# Patient Record
Sex: Male | Born: 1985 | Race: Black or African American | Hispanic: No | Marital: Married | Smoking: Never smoker
Health system: Southern US, Community
[De-identification: ages and names within clinical notes are randomized; demographics above are authoritative.]

## PROBLEM LIST (undated history)

## (undated) DIAGNOSIS — F909 Attention-deficit hyperactivity disorder, unspecified type: Secondary | ICD-10-CM

## (undated) DIAGNOSIS — F419 Anxiety disorder, unspecified: Secondary | ICD-10-CM

## (undated) DIAGNOSIS — E7800 Pure hypercholesterolemia, unspecified: Secondary | ICD-10-CM

## (undated) HISTORY — PX: TONSILLECTOMY: SUR1361

## (undated) HISTORY — DX: Pure hypercholesterolemia, unspecified: E78.00

## (undated) HISTORY — DX: Anxiety disorder, unspecified: F41.9

## (undated) HISTORY — DX: Attention-deficit hyperactivity disorder, unspecified type: F90.9

---

## 2007-01-18 ENCOUNTER — Emergency Department (HOSPITAL_COMMUNITY): Admission: EM | Admit: 2007-01-18 | Discharge: 2007-01-18 | Payer: Self-pay | Admitting: Emergency Medicine

## 2008-04-01 ENCOUNTER — Emergency Department (HOSPITAL_COMMUNITY): Admission: EM | Admit: 2008-04-01 | Discharge: 2008-04-01 | Payer: Self-pay | Admitting: Emergency Medicine

## 2008-09-09 ENCOUNTER — Emergency Department (HOSPITAL_COMMUNITY): Admission: EM | Admit: 2008-09-09 | Discharge: 2008-09-09 | Payer: Self-pay | Admitting: Emergency Medicine
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
## 2009-03-17 ENCOUNTER — Emergency Department (HOSPITAL_COMMUNITY): Admission: EM | Admit: 2009-03-17 | Discharge: 2009-03-17 | Payer: Self-pay | Admitting: Emergency Medicine

## 2010-06-18 IMAGING — CR DG CHEST 2V
2 series · 2 of 2 positions shown · non-contrast
Comparison: None

CLINICAL DATA: Shortness of breath

CHEST - 2 VIEW

[w chest pa]
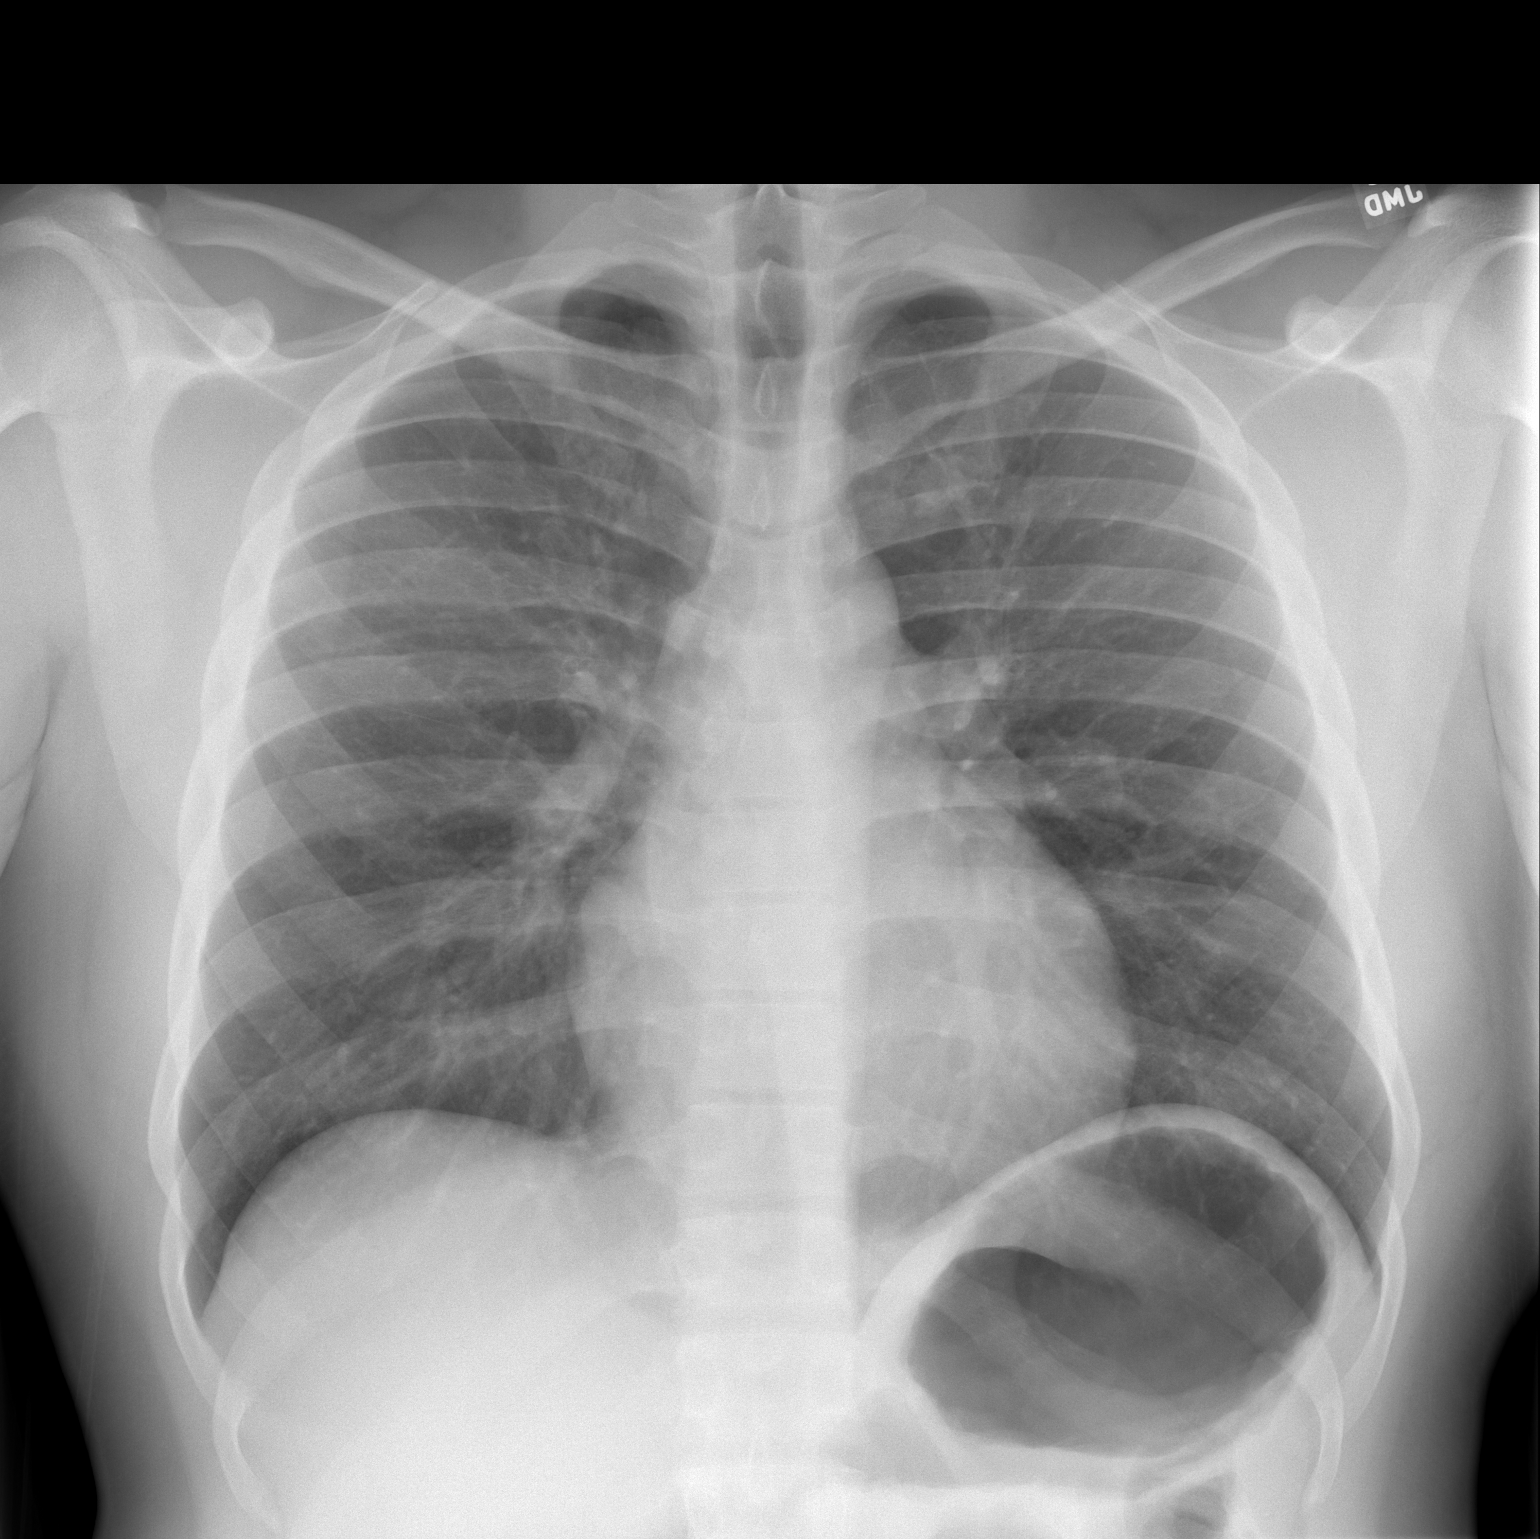

[w chest lat]
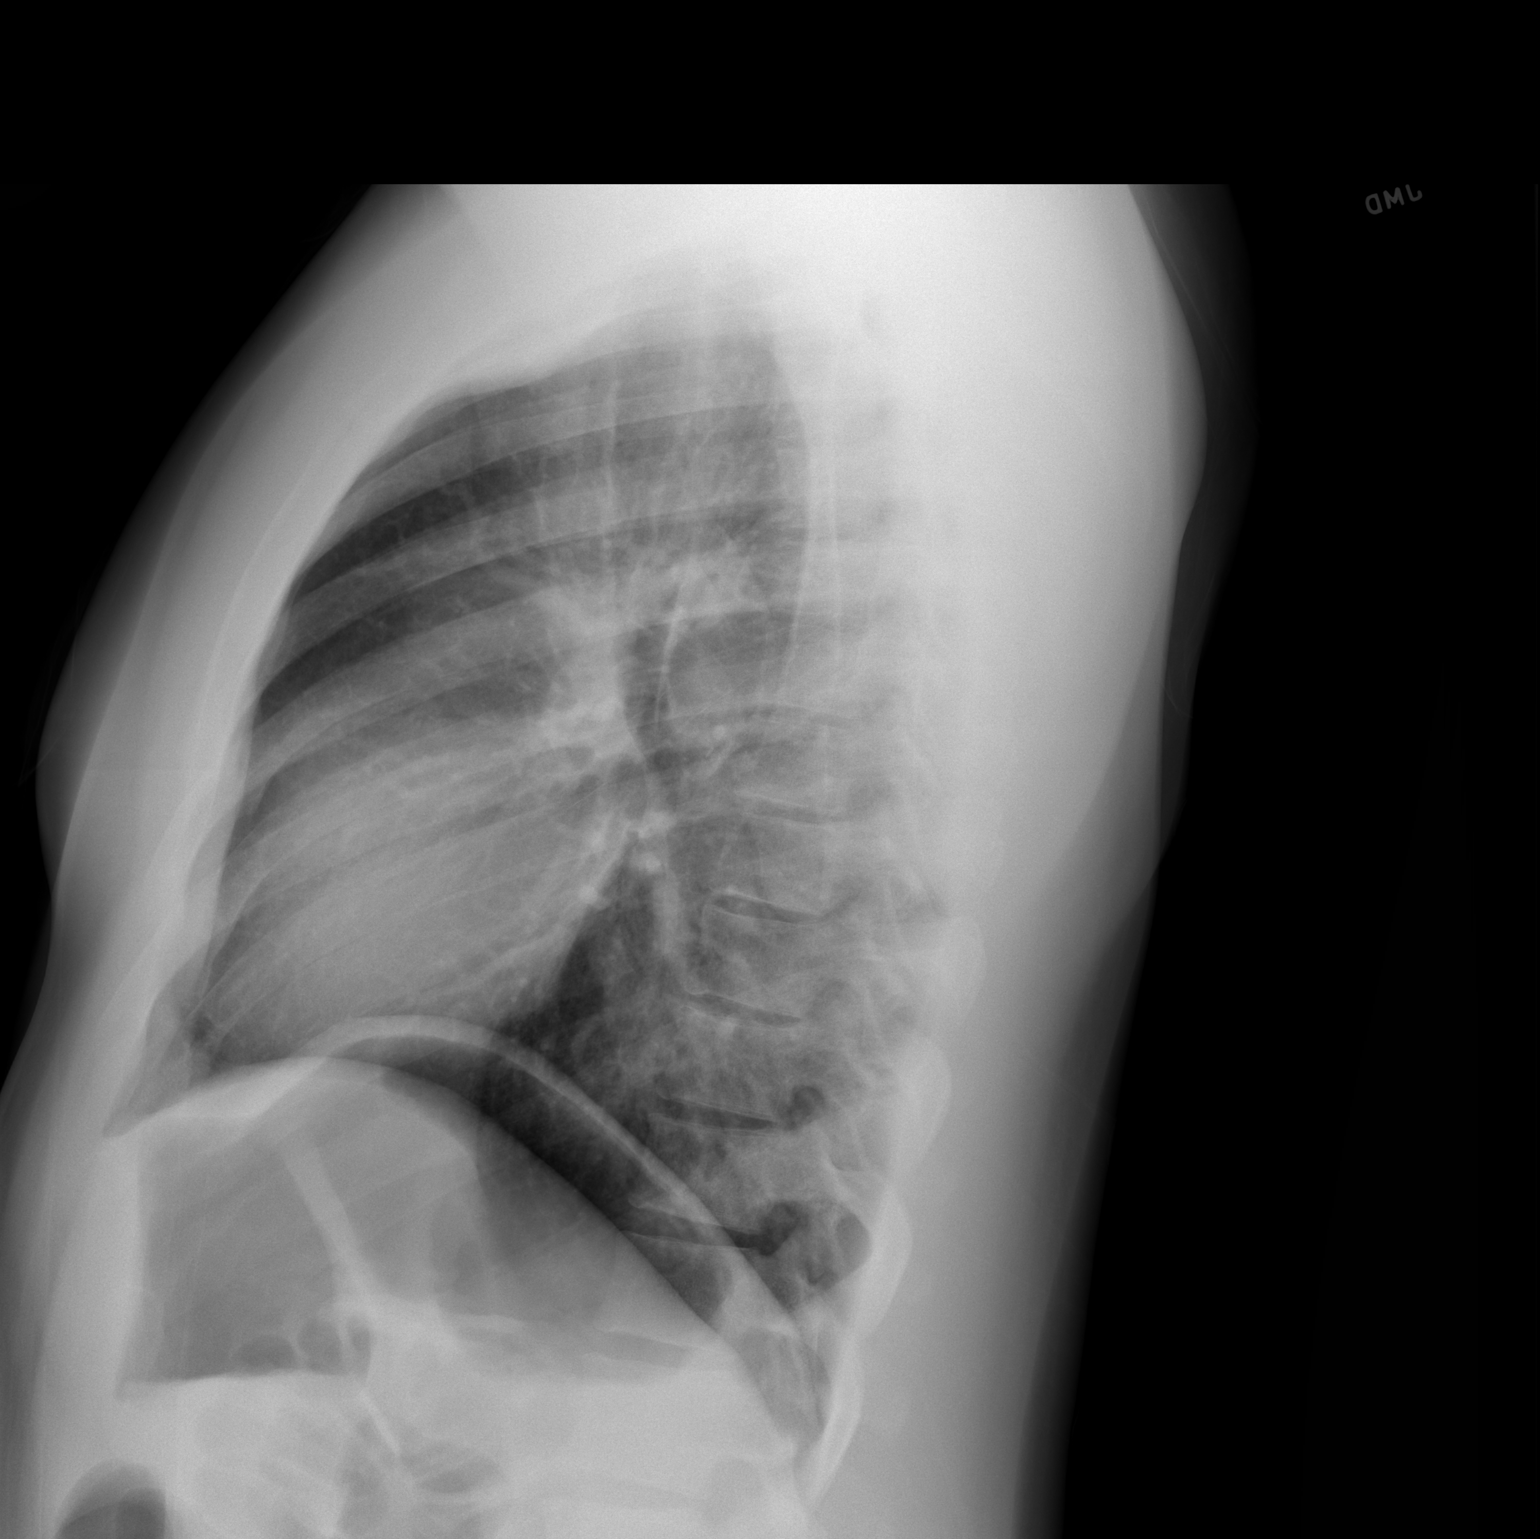

[2 of 2 positions shown; findings below may reference images not displayed]

FINDINGS: Heart size and mediastinal contours are normal.

No pleural effusion or pulmonary interstitial edema.

There is no airspace densities identified.
IMPRESSION: 1.  No active cardiopulmonary disease.

## 2011-08-14 LAB — URINALYSIS, ROUTINE W REFLEX MICROSCOPIC
Glucose, UA: NEGATIVE
Hgb urine dipstick: NEGATIVE
Nitrite: NEGATIVE
Protein, ur: NEGATIVE
Specific Gravity, Urine: 1.014
Urobilinogen, UA: 1

## 2011-08-14 LAB — CBC
HCT: 37.7 — ABNORMAL LOW
MCV: 85.6
Platelets: 177
RBC: 4.4
WBC: 6.5

## 2011-08-14 LAB — DIFFERENTIAL
Basophils Relative: 1
Eosinophils Absolute: 0.1
Lymphocytes Relative: 30
Neutro Abs: 3.9
Neutrophils Relative %: 60

## 2011-08-14 LAB — COMPREHENSIVE METABOLIC PANEL
Albumin: 3.5
BUN: 9
CO2: 24
Calcium: 8.7
Chloride: 103
GFR calc Af Amer: >60
GFR calc non Af Amer: >60
Sodium: 136
Total Bilirubin: 1.4 — ABNORMAL HIGH
Total Protein: 6.5

## 2011-08-14 LAB — LIPASE, BLOOD: Lipase: 18

## 2011-08-14 LAB — AMYLASE: Amylase: 114

## 2015-12-14 ENCOUNTER — Ambulatory Visit: Payer: Self-pay | Admitting: Licensed Clinical Social Worker

## 2021-05-15 ENCOUNTER — Institutional Professional Consult (permissible substitution): Payer: Self-pay | Admitting: Neurology

## 2021-06-28 ENCOUNTER — Institutional Professional Consult (permissible substitution): Payer: Self-pay | Admitting: Neurology

## 2021-08-22 ENCOUNTER — Encounter: Payer: Self-pay | Admitting: *Deleted

## 2021-08-23 ENCOUNTER — Encounter: Payer: Self-pay | Admitting: Neurology

## 2021-08-23 ENCOUNTER — Other Ambulatory Visit: Payer: Self-pay

## 2021-08-23 ENCOUNTER — Ambulatory Visit (INDEPENDENT_AMBULATORY_CARE_PROVIDER_SITE_OTHER): Payer: No Typology Code available for payment source | Admitting: Neurology

## 2021-08-23 VITALS — BP 120/74 | HR 71 | Ht 75.0 in | Wt 303.8 lb

## 2021-08-23 DIAGNOSIS — E669 Obesity, unspecified: Secondary | ICD-10-CM | POA: Diagnosis not present

## 2021-08-23 DIAGNOSIS — G4719 Other hypersomnia: Secondary | ICD-10-CM

## 2021-08-23 DIAGNOSIS — R400 Somnolence: Secondary | ICD-10-CM

## 2021-08-23 DIAGNOSIS — R7989 Other specified abnormal findings of blood chemistry: Secondary | ICD-10-CM

## 2021-08-23 DIAGNOSIS — Z9189 Other specified personal risk factors, not elsewhere classified: Secondary | ICD-10-CM

## 2021-08-23 DIAGNOSIS — R0683 Snoring: Secondary | ICD-10-CM

## 2021-08-23 DIAGNOSIS — R0681 Apnea, not elsewhere classified: Secondary | ICD-10-CM

## 2021-08-23 NOTE — Patient Instructions (Signed)
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
## 2021-10-22 ENCOUNTER — Ambulatory Visit (INDEPENDENT_AMBULATORY_CARE_PROVIDER_SITE_OTHER): Payer: No Typology Code available for payment source | Admitting: Neurology

## 2021-10-22 DIAGNOSIS — R0681 Apnea, not elsewhere classified: Secondary | ICD-10-CM

## 2021-10-22 DIAGNOSIS — E669 Obesity, unspecified: Secondary | ICD-10-CM

## 2021-10-22 DIAGNOSIS — G4719 Other hypersomnia: Secondary | ICD-10-CM

## 2021-10-22 DIAGNOSIS — R400 Somnolence: Secondary | ICD-10-CM

## 2021-10-22 DIAGNOSIS — G4733 Obstructive sleep apnea (adult) (pediatric): Secondary | ICD-10-CM

## 2021-10-22 DIAGNOSIS — R0683 Snoring: Secondary | ICD-10-CM

## 2021-10-22 DIAGNOSIS — Z9189 Other specified personal risk factors, not elsewhere classified: Secondary | ICD-10-CM

## 2021-10-22 DIAGNOSIS — R7989 Other specified abnormal findings of blood chemistry: Secondary | ICD-10-CM

## 2021-10-23 NOTE — Progress Notes (Signed)
   [facility]  HOME SLEEP TEST (Watch PAT) REPORT  STUDY DATE: 10/22/2021  DOB: 1986/04/15  MRN: [id number]  ORDERING CLINICIAN: Huston Foley, MD, PhD   REFERRING CLINICIAN: Dr. Elisabeth Most  CLINICAL INFORMATION/HISTORY: 35 year old right-handed gentleman with an underlying medical history of ADHD, low testosterone and obesity, who reports snoring and excessive daytime somnolence as well as witnessed apneas per wife's report.   Epworth sleepiness score: 17/24.  BMI: 38.2 kg/m  FINDINGS:   Sleep Summary:   Total Recording Time (hours, min): 7 hours, 4 minutes  Total Sleep Time (hours, min):  6 hours, 19 minutes   Percent REM (%):    39.4%   Respiratory Indices:   Calculated pAHI (per hour):  22.6/hour         REM pAHI:    37/hour       NREM pAHI: 13.3/hour  Oxygen Saturation Statistics:    Oxygen Saturation (%) Mean: 95%   Minimum oxygen saturation (%):                 87%   O2 Saturation Range (%): 87-99%    O2 Saturation (minutes) <=88%: 0.3 min  Pulse Rate Statistics:   Pulse Mean (bpm):    72/min    Pulse Range (48-109/min)   IMPRESSION: OSA (obstructive sleep apnea)   RECOMMENDATION:  This home sleep test demonstrates moderate obstructive sleep apnea with a total AHI of 22.6/hour and O2 nadir of 87%.  Mild to moderate snoring was detected.  Treatment with positive airway pressure is recommended. The patient will be advised to proceed with an autoPAP titration/trial at home for now. A full night titration study may be considered to optimize treatment settings, if needed down the road.  Alternative treatment options may include surgical options, or a dental device.  Concomitant weight loss is recommended.  Please note that untreated obstructive sleep apnea may carry additional perioperative morbidity. Patients with significant obstructive sleep apnea should receive perioperative PAP therapy and the surgeons and particularly the  anesthesiologist should be informed of the diagnosis and the severity of the sleep disordered breathing. The patient should be cautioned not to drive, work at heights, or operate dangerous or heavy equipment when tired or sleepy. Review and reiteration of good sleep hygiene measures should be pursued with any patient. Other causes of the patient's symptoms, including circadian rhythm disturbances, an underlying mood disorder, medication effect and/or an underlying medical problem cannot be ruled out based on this test. Clinical correlation is recommended. The patient and his referring provider will be notified of the test results. The patient will be seen in follow up in sleep clinic at [facility].  I certify that I have reviewed the raw data recording prior to the issuance of this report in accordance with the standards of the American Academy of Sleep Medicine (AASM).  INTERPRETING PHYSICIAN:   Huston Foley, MD, PhD  Board Certified in Neurology and Sleep Medicine  [facility] [address], [address] [place], [place] [address] [phone]

## 2021-10-25 NOTE — Addendum Note (Signed)
Addended by: Huston Foley on: 10/25/2021 05:34 PM   Modules accepted: Orders

## 2021-10-25 NOTE — Procedures (Signed)
   [facility]  HOME SLEEP TEST (Watch PAT) REPORT  STUDY DATE: 10/22/2021  DOB: 1986/04/15  MRN: [id number]  ORDERING CLINICIAN: Huston Foley, MD, PhD   REFERRING CLINICIAN: Dr. Elisabeth Most  CLINICAL INFORMATION/HISTORY: 35 year old right-handed gentleman with an underlying medical history of ADHD, low testosterone and obesity, who reports snoring and excessive daytime somnolence as well as witnessed apneas per wife's report.   Epworth sleepiness score: 17/24.  BMI: 38.2 kg/m  FINDINGS:   Sleep Summary:   Total Recording Time (hours, min): 7 hours, 4 minutes  Total Sleep Time (hours, min):  6 hours, 19 minutes   Percent REM (%):    39.4%   Respiratory Indices:   Calculated pAHI (per hour):  22.6/hour         REM pAHI:    37/hour       NREM pAHI: 13.3/hour  Oxygen Saturation Statistics:    Oxygen Saturation (%) Mean: 95%   Minimum oxygen saturation (%):                 87%   O2 Saturation Range (%): 87-99%    O2 Saturation (minutes) <=88%: 0.3 min  Pulse Rate Statistics:   Pulse Mean (bpm):    72/min    Pulse Range (48-109/min)   IMPRESSION: OSA (obstructive sleep apnea)   RECOMMENDATION:  This home sleep test demonstrates moderate obstructive sleep apnea with a total AHI of 22.6/hour and O2 nadir of 87%.  Mild to moderate snoring was detected.  Treatment with positive airway pressure is recommended. The patient will be advised to proceed with an autoPAP titration/trial at home for now. A full night titration study may be considered to optimize treatment settings, if needed down the road.  Alternative treatment options may include surgical options, or a dental device.  Concomitant weight loss is recommended.  Please note that untreated obstructive sleep apnea may carry additional perioperative morbidity. Patients with significant obstructive sleep apnea should receive perioperative PAP therapy and the surgeons and particularly the  anesthesiologist should be informed of the diagnosis and the severity of the sleep disordered breathing. The patient should be cautioned not to drive, work at heights, or operate dangerous or heavy equipment when tired or sleepy. Review and reiteration of good sleep hygiene measures should be pursued with any patient. Other causes of the patient's symptoms, including circadian rhythm disturbances, an underlying mood disorder, medication effect and/or an underlying medical problem cannot be ruled out based on this test. Clinical correlation is recommended. The patient and his referring provider will be notified of the test results. The patient will be seen in follow up in sleep clinic at [facility].  I certify that I have reviewed the raw data recording prior to the issuance of this report in accordance with the standards of the American Academy of Sleep Medicine (AASM).  INTERPRETING PHYSICIAN:   Huston Foley, MD, PhD  Board Certified in Neurology and Sleep Medicine  [facility] [address], [address] [place], [place] [address] [phone]

## 2021-10-29 ENCOUNTER — Encounter: Payer: Self-pay | Admitting: *Deleted

## 2021-10-29 ENCOUNTER — Telehealth: Payer: Self-pay | Admitting: *Deleted

## 2021-10-29 NOTE — Telephone Encounter (Addendum)
Spoke with patient and discussed sleep study results.  Patient aware the recent home sleep test shows obstructive sleep apnea in the moderate range recommendation is to treat this in the form of AutoPap.  Discussed with patient what this means and also the main difference between AutoPap and CPAP.  Patient is amenable to proceeding with AutoPap use.  I let him know we will send orders over to [facility]. Patient aware of insurance compliance requirements which includes using the machine at least 4 hours every night and following up in the sleep clinic 30 to 90 days after set up and also yearly.  Patient mentioned that he travels for work and will be away from home 1 night per week.  He is concerned he would not be able to have the proper set up for CPAP machine when he is away from home.   Pt requests follow-up letter to be sent to him through Mychart and at home. Orders, ss results, office note, and insurance info sent to [facility] for processing. Received a receipt of confirmation. Letter mailed to pt's home.

## 2021-10-29 NOTE — Telephone Encounter (Signed)
-----   Message from Huston Foley, MD sent at 10/25/2021  5:34 PM EST ----- Patient referred by Dr. Elisabeth Most, seen by me on 08/23/2021, patient had a HST on 10/22/2021.    Please call and notify the patient that the recent home sleep test showed obstructive sleep apnea in the moderate range. I recommend treatment in the form of autoPAP, which means, that we don't have to bring him in for a sleep study with CPAP, but will let him start using a so called autoPAP machine at home, which is a CPAP-like machine with self-adjusting pressures. We will send the order to a local DME company (of his choice, or as per insurance requirement). The DME representative will fit him with a mask, educate him on how to use the machine, how to put the mask on, etc. I have placed an order in the chart. Please send the order, talk to patient, send report to referring MD. We will need a FU in sleep clinic for 10 weeks post-PAP set up, please arrange that with me or one of our NPs. Also reinforce the need for compliance with treatment. Thanks,   Huston Foley, MD, PhD [facility])

## 2021-11-08 NOTE — Telephone Encounter (Signed)
Spoke with [facility]. The pt's insurance is not in network with [facility]. I have reached out to [facility] to see if they can take his insurance. Will update ASAP. Updated pt via mychart.

## 2021-11-08 NOTE — Telephone Encounter (Signed)
Pt called states his insurance denied the CPAP. Pt requesting a call back.

## 2021-11-08 NOTE — Telephone Encounter (Signed)
We will contact [facility] first to check into this. We do not do the CPAP authorizations.

## 2021-11-13 NOTE — Telephone Encounter (Signed)
Ross Ludwig, RN; Dimas Millin we are checking on this one.      Previous Messages   ----- Message -----  From: Guy Begin, RN  Sent: 11/13/2021  10:45 AM EST  To: Dimas Millin, Jari Favre, *  Subject: new order in epic,                              Ardelle Lesches  Male, 35 y.o., 07/05/1986  MRN:  [id number]  Phone:  [phone] (H) ...   I am following up on this message.    Thank you Andrey Campanile RN   I called [facility]. They do not take your insurance however I am in the process of waiting to hear back from [facility] to see if they take your insurance. I will update you as soon as I hear back. It might be next week but will update sooner if possible. Thank you for your patience. Merry Christmas!

## 2021-11-18 DIAGNOSIS — G4733 Obstructive sleep apnea (adult) (pediatric): Secondary | ICD-10-CM

## 2021-11-18 HISTORY — DX: Obstructive sleep apnea (adult) (pediatric): G47.33
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
## 2022-01-17 ENCOUNTER — Other Ambulatory Visit: Payer: Self-pay

## 2022-01-17 ENCOUNTER — Ambulatory Visit (INDEPENDENT_AMBULATORY_CARE_PROVIDER_SITE_OTHER): Payer: No Typology Code available for payment source | Admitting: Adult Health

## 2022-01-17 ENCOUNTER — Encounter: Payer: Self-pay | Admitting: Adult Health

## 2022-01-17 VITALS — BP 125/73 | HR 68 | Ht 75.0 in | Wt 304.2 lb

## 2022-01-17 DIAGNOSIS — G4733 Obstructive sleep apnea (adult) (pediatric): Secondary | ICD-10-CM | POA: Diagnosis not present

## 2022-01-17 DIAGNOSIS — Z9989 Dependence on other enabling machines and devices: Secondary | ICD-10-CM

## 2022-05-09 ENCOUNTER — Encounter: Payer: Self-pay | Admitting: Adult Health
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
## 2022-07-30 ENCOUNTER — Telehealth: Payer: Self-pay

## 2022-07-30 ENCOUNTER — Ambulatory Visit (INDEPENDENT_AMBULATORY_CARE_PROVIDER_SITE_OTHER): Payer: Commercial Managed Care - PPO | Admitting: Adult Health

## 2022-07-30 ENCOUNTER — Encounter: Payer: Self-pay | Admitting: Adult Health

## 2022-07-30 VITALS — BP 118/77 | HR 63 | Ht 75.0 in | Wt 311.0 lb

## 2022-07-30 DIAGNOSIS — Z9989 Dependence on other enabling machines and devices: Secondary | ICD-10-CM

## 2022-07-30 DIAGNOSIS — G4733 Obstructive sleep apnea (adult) (pediatric): Secondary | ICD-10-CM | POA: Diagnosis not present

## 2022-07-30 NOTE — Telephone Encounter (Signed)
CPAP orders have been placed and sent to [facility] via [facility] for pt.

## 2022-07-30 NOTE — Patient Instructions (Signed)
Your Plan:  Continue use of CPAP ensuring greater than 4 hours nightly per insurance requirements and for optimal benefit         Thank you for coming to see [place] at [facility]. I hope we have been able to provide you high quality care today.  You may receive a patient satisfaction survey over the next few weeks. We would appreciate your feedback and comments so that we may continue to improve ourselves and the health of our patients.

## 2023-02-26 ENCOUNTER — Encounter: Payer: Self-pay | Admitting: Adult Health

## 2023-05-05 ENCOUNTER — Encounter: Payer: Self-pay | Admitting: Adult Health

## 2023-05-09 ENCOUNTER — Encounter: Payer: Self-pay | Admitting: Neurology

## 2023-06-19 ENCOUNTER — Other Ambulatory Visit (HOSPITAL_BASED_OUTPATIENT_CLINIC_OR_DEPARTMENT_OTHER): Payer: Self-pay

## 2023-06-19 MED ORDER — LISDEXAMFETAMINE DIMESYLATE 20 MG PO CAPS
20.0000 mg | ORAL_CAPSULE | Freq: Every day | ORAL | 0 refills | Status: DC
  Filled 2023-06-19: qty 30, 30d supply, fill #0

## 2023-07-20 ENCOUNTER — Other Ambulatory Visit (HOSPITAL_BASED_OUTPATIENT_CLINIC_OR_DEPARTMENT_OTHER): Payer: Self-pay

## 2023-07-21 ENCOUNTER — Other Ambulatory Visit (HOSPITAL_BASED_OUTPATIENT_CLINIC_OR_DEPARTMENT_OTHER): Payer: Self-pay

## 2023-07-21 MED ORDER — LISDEXAMFETAMINE DIMESYLATE 20 MG PO CAPS
20.0000 mg | ORAL_CAPSULE | Freq: Every day | ORAL | 0 refills | Status: DC
  Filled 2023-07-21: qty 30, 30d supply, fill #0
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
## 2023-07-28 NOTE — Telephone Encounter (Signed)
Was machine brought in for download? He has CPAP visit scheduled tomorrow for follow up.

## 2023-07-29 ENCOUNTER — Encounter: Payer: Self-pay | Admitting: Adult Health

## 2023-07-29 ENCOUNTER — Ambulatory Visit: Payer: BC Managed Care – PPO | Admitting: Adult Health

## 2023-07-29 VITALS — BP 107/71 | HR 70 | Ht 75.0 in | Wt 286.0 lb

## 2023-07-29 DIAGNOSIS — G4733 Obstructive sleep apnea (adult) (pediatric): Secondary | ICD-10-CM | POA: Diagnosis not present

## 2023-07-29 NOTE — Patient Instructions (Addendum)
Your Plan:  Concern nightly use of CPAP with ensuring greater than 4 hours per night for optimal benefit  Continue to follow with your DME company [employer] for any needed supplies or CPAP related concerns     Follow up in 1 year or call earlier if needed     Thank you for coming to see [place] at [facility]. I hope we have been able to provide you high quality care today.  You may receive a patient satisfaction survey over the next few weeks. We would appreciate your feedback and comments so that we may continue to improve ourselves and the health of our patients.

## 2023-08-01 ENCOUNTER — Encounter: Payer: Self-pay | Admitting: Adult Health

## 2023-08-20 ENCOUNTER — Other Ambulatory Visit (HOSPITAL_BASED_OUTPATIENT_CLINIC_OR_DEPARTMENT_OTHER): Payer: Self-pay

## 2023-08-20 MED ORDER — LISDEXAMFETAMINE DIMESYLATE 20 MG PO CAPS
20.0000 mg | ORAL_CAPSULE | Freq: Every day | ORAL | 0 refills | Status: DC
  Filled 2023-08-20: qty 30, 30d supply, fill #0

## 2023-09-02 ENCOUNTER — Other Ambulatory Visit (HOSPITAL_BASED_OUTPATIENT_CLINIC_OR_DEPARTMENT_OTHER): Payer: Self-pay

## 2023-09-04 ENCOUNTER — Encounter: Payer: Self-pay | Admitting: Neurology

## 2023-09-04 ENCOUNTER — Encounter: Payer: Self-pay | Admitting: Adult Health

## 2023-09-19 ENCOUNTER — Other Ambulatory Visit (HOSPITAL_BASED_OUTPATIENT_CLINIC_OR_DEPARTMENT_OTHER): Payer: Self-pay

## 2023-09-22 ENCOUNTER — Other Ambulatory Visit (HOSPITAL_BASED_OUTPATIENT_CLINIC_OR_DEPARTMENT_OTHER): Payer: Self-pay

## 2023-09-22 MED ORDER — LISDEXAMFETAMINE DIMESYLATE 20 MG PO CAPS
20.0000 mg | ORAL_CAPSULE | Freq: Every day | ORAL | 0 refills | Status: DC
  Filled 2023-09-22: qty 30, 30d supply, fill #0

## 2023-10-22 ENCOUNTER — Other Ambulatory Visit (HOSPITAL_BASED_OUTPATIENT_CLINIC_OR_DEPARTMENT_OTHER): Payer: Self-pay

## 2023-10-23 ENCOUNTER — Other Ambulatory Visit (HOSPITAL_BASED_OUTPATIENT_CLINIC_OR_DEPARTMENT_OTHER): Payer: Self-pay

## 2023-10-23 MED ORDER — LISDEXAMFETAMINE DIMESYLATE 20 MG PO CAPS
20.0000 mg | ORAL_CAPSULE | Freq: Every day | ORAL | 0 refills | Status: DC
  Filled 2023-10-23: qty 30, 30d supply, fill #0

## 2023-11-20 ENCOUNTER — Other Ambulatory Visit (HOSPITAL_BASED_OUTPATIENT_CLINIC_OR_DEPARTMENT_OTHER): Payer: Self-pay

## 2023-11-21 ENCOUNTER — Other Ambulatory Visit (HOSPITAL_BASED_OUTPATIENT_CLINIC_OR_DEPARTMENT_OTHER): Payer: Self-pay

## 2023-11-24 ENCOUNTER — Other Ambulatory Visit (HOSPITAL_BASED_OUTPATIENT_CLINIC_OR_DEPARTMENT_OTHER): Payer: Self-pay

## 2023-11-24 MED ORDER — LISDEXAMFETAMINE DIMESYLATE 20 MG PO CAPS
20.0000 mg | ORAL_CAPSULE | Freq: Every day | ORAL | 0 refills | Status: DC
  Filled 2023-11-24: qty 30, 30d supply, fill #0

## 2024-01-01 ENCOUNTER — Other Ambulatory Visit (HOSPITAL_BASED_OUTPATIENT_CLINIC_OR_DEPARTMENT_OTHER): Payer: Self-pay

## 2024-01-08 ENCOUNTER — Other Ambulatory Visit (HOSPITAL_BASED_OUTPATIENT_CLINIC_OR_DEPARTMENT_OTHER): Payer: Self-pay

## 2024-01-08 MED ORDER — LISDEXAMFETAMINE DIMESYLATE 20 MG PO CAPS
20.0000 mg | ORAL_CAPSULE | Freq: Every day | ORAL | 0 refills | Status: DC
  Filled 2024-01-08: qty 30, 30d supply, fill #0

## 2024-02-06 ENCOUNTER — Other Ambulatory Visit (HOSPITAL_BASED_OUTPATIENT_CLINIC_OR_DEPARTMENT_OTHER): Payer: Self-pay

## 2024-02-10 ENCOUNTER — Other Ambulatory Visit (HOSPITAL_BASED_OUTPATIENT_CLINIC_OR_DEPARTMENT_OTHER): Payer: Self-pay

## 2024-02-10 MED ORDER — LISDEXAMFETAMINE DIMESYLATE 20 MG PO CAPS
20.0000 mg | ORAL_CAPSULE | Freq: Every day | ORAL | 0 refills | Status: AC
  Filled 2024-02-10: qty 30, 30d supply, fill #0

## 2024-03-10 ENCOUNTER — Other Ambulatory Visit (HOSPITAL_BASED_OUTPATIENT_CLINIC_OR_DEPARTMENT_OTHER): Payer: Self-pay

## 2024-03-10 MED ORDER — LISDEXAMFETAMINE DIMESYLATE 30 MG PO CAPS
30.0000 mg | ORAL_CAPSULE | Freq: Every day | ORAL | 0 refills | Status: DC
  Filled 2024-03-10: qty 30, 30d supply, fill #0

## 2024-04-05 ENCOUNTER — Other Ambulatory Visit (HOSPITAL_BASED_OUTPATIENT_CLINIC_OR_DEPARTMENT_OTHER): Payer: Self-pay

## 2024-04-06 ENCOUNTER — Other Ambulatory Visit (HOSPITAL_BASED_OUTPATIENT_CLINIC_OR_DEPARTMENT_OTHER): Payer: Self-pay

## 2024-04-06 MED ORDER — LISDEXAMFETAMINE DIMESYLATE 30 MG PO CAPS
30.0000 mg | ORAL_CAPSULE | Freq: Every day | ORAL | 0 refills | Status: DC
  Filled 2024-04-09: qty 30, 30d supply, fill #0

## 2024-04-09 ENCOUNTER — Ambulatory Visit (INDEPENDENT_AMBULATORY_CARE_PROVIDER_SITE_OTHER): Admitting: Psychology

## 2024-04-09 ENCOUNTER — Other Ambulatory Visit (HOSPITAL_BASED_OUTPATIENT_CLINIC_OR_DEPARTMENT_OTHER): Payer: Self-pay

## 2024-04-09 DIAGNOSIS — F439 Reaction to severe stress, unspecified: Secondary | ICD-10-CM | POA: Diagnosis not present

## 2024-04-09 DIAGNOSIS — Z630 Problems in relationship with spouse or partner: Secondary | ICD-10-CM | POA: Diagnosis not present

## 2024-04-09 NOTE — Progress Notes (Unsigned)
 [facility] Counsel[facility] Initial Adult Exam  Name: Justin [name] Date: 04/09/2024 MRN: [id number] DOB: 10-11-1986 PCP: Pcp, No  Time Spent: 10:00 am - 10:   am :  minutes   Guardian/Payee:  Self    Paperwork requested: No   Reason for Visit /Presenting Problem: "My marriage is over after 38 years old. My wife is done with our marriage. I put up walls."  Mental Status Exam: Appearance:   Well Groomed     Behavior:  Appropriate  Motor:  Normal  Speech/Language:   Normal Rate  Affect:  Appropriate  Mood:  normal  Thought process:  normal  Thought content:    WNL  Sensory/Perceptual disturbances:    WNL  Orientation:  oriented to person, place, and time/date  Attention:  Good  Concentration:  Good  Memory:  WNL  Fund of knowledge:   Good  Insight:    Good  Judgment:   Good  Impulse Control:  Good   Reported Symptoms:  ***  Risk Assessment: Danger to Self:  {PSY:22692} Self-injurious Behavior: {PSY:22692} Danger to Others: {PSY:22692} Duty to Warn:{PSY:311194} Physical Aggression / Violence:{PSY:21197} Access to Firearms a concern: {PSY:21197} Gang Involvement:{PSY:21197} Patient / guardian was educated about steps to take if suicide or homicide risk level increases between visits: {Yes/No-Ex:120004} While future psychiatric events cannot be accurately predicted, the patient does not currently require acute inpatient psychiatric care and does not currently meet [facility]  involuntary commitment criteria.  Substance Abuse History: Current substance abuse: {PSY:21197}    Caffeine: Tobacco: Alcohol: Substance use:  Past Psychiatric History:   Previous psychological history is significant for depression, anxiety, marital issues in early twenties Outpatient Providers: Justin [name], saw him for 2 years, at [name]/ Dr. Maeola Schmidt- [place] Attn. [name] History of Psych Hospitalization: No  Psychological Testing: Testosterone  testing, patient had low results in the past, and then just started    Abuse History:  Victim of: No.  Report needed: No. Victim of Neglect:No. Perpetrator of No  Witness / Exposure to Domestic Violence: No   Protective Services Involvement: No  Witness to [employer] Violence:  No   Family History:  Family History  Problem Relation Age of Onset   Diabetes Mother    Cancer Mother    Drug abuse Brother     Living situation: the patient lives with their family, wife Justin [name], Justin [name] 18, Justin [name] 49, Justin [name] 12  Sexual Orientation: Straight  Relationship Status: married  Name of spouse / other:Justin [name], Justin [name] If a parent, number of children / ages: Justin [name] 18, Justin [name] 72, Justin [name] 12  Support Systems: Justin [name] is the only person I can Nature conservation officer Stress:  Yes , I'm not good at finances, budget, or dealing with debt, my wife is very good  Income/Employment/Disability: Employment, Catering manager, 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, drives trucks  Financial planner: No   Educational History: Education: some college  Religion/Sprituality/World View: Non denominational Production designer, theatre/television/film   Any cultural differences that may affect / interfere with treatment:  not applicable   Recreation/Hobbies: Watching sports  Stressors: Marital or family conflict  , my inability to have consistent relationships with people, dealing with family-biological family and children   Strengths: Spirituality = Prayer  Barriers:  None   Legal History: Pending legal issue / charges: The patient has no significant history of legal issues. History of legal issue / charges: N/A  Medical History/Surgical History: reviewed Past Medical History:  Diagnosis Date   ADHD    Anxiety  High cholesterol    OSA on CPAP 2023    Past Surgical History:  Procedure Laterality Date   TONSILLECTOMY      Medications: Current Outpatient Medications  Medication Sig Dispense Refill   lisdexamfetamine (VYVANSE ) 20 MG capsule Take 1 capsule (20 mg  total) by mouth daily. 30 capsule 0   lisdexamfetamine (VYVANSE ) 30 MG capsule Take 1 capsule (30 mg total) by mouth daily. 30 capsule 0   No current facility-administered medications for this visit.    No Known Allergies  Diagnoses:    Psychiatric Treatment: No   Plan of Care: OPT and patient will get a PCP to get a physical ASAP to include testosterone testing  Narrative:  Justin [name] participated from office with therapist and consented to treatment. We reviewed the limits of confidentiality prior to the start of the evaluation. Justin [name] expressed understanding and agreement to proceed.    Currently on medication from an Attention Specialist, upped his dosage recently  A follow-up was scheduled to create a treatment plan and begin treatment. Therapist answered  and all questions during the evaluation and contact information was provided.    Justin [name]

## 2024-04-22 ENCOUNTER — Ambulatory Visit: Admitting: Psychology

## 2024-04-30 ENCOUNTER — Other Ambulatory Visit: Payer: Self-pay

## 2024-04-30 ENCOUNTER — Encounter (HOSPITAL_BASED_OUTPATIENT_CLINIC_OR_DEPARTMENT_OTHER): Payer: Self-pay

## 2024-04-30 ENCOUNTER — Emergency Department (HOSPITAL_BASED_OUTPATIENT_CLINIC_OR_DEPARTMENT_OTHER): Payer: Self-pay

## 2024-04-30 DIAGNOSIS — M549 Dorsalgia, unspecified: Secondary | ICD-10-CM | POA: Diagnosis present

## 2024-04-30 DIAGNOSIS — M546 Pain in thoracic spine: Secondary | ICD-10-CM | POA: Insufficient documentation

## 2024-04-30 LAB — CBC
HCT: 43.8 % (ref 39.0–52.0)
Hemoglobin: 14.4 g/dL (ref 13.0–17.0)
MCH: 28.9 pg (ref 26.0–34.0)
MCHC: 32.9 g/dL (ref 30.0–36.0)
MCV: 87.8 fL (ref 80.0–100.0)
Platelets: 312 10*3/uL (ref 150–400)
RBC: 4.99 MIL/uL (ref 4.22–5.81)
RDW: 12.5 % (ref 11.5–15.5)
WBC: 8 10*3/uL (ref 4.0–10.5)
nRBC: 0 % (ref 0.0–0.2)

## 2024-04-30 NOTE — ED Triage Notes (Signed)
 Pt reports going to urgent care on Sunday for mid-back pain radiating around right side. Pain worsened today and back started locking up per patient. Pain is worse with movement and with deep breaths. Pt reports long flights recently to [place]  prior to pain starting. Denies SOB, CP, fever, urinary symptoms.

## 2024-05-01 ENCOUNTER — Emergency Department (HOSPITAL_BASED_OUTPATIENT_CLINIC_OR_DEPARTMENT_OTHER)
Admission: EM | Admit: 2024-05-01 | Discharge: 2024-05-01 | Disposition: A | Discharge status: Home / Self Care | Payer: Self-pay | Attending: Emergency Medicine | Admitting: Emergency Medicine

## 2024-05-01 DIAGNOSIS — M549 Dorsalgia, unspecified: Secondary | ICD-10-CM

## 2024-05-01 LAB — HEPATIC FUNCTION PANEL
ALT: 48 U/L — ABNORMAL HIGH (ref 0–44)
AST: 31 U/L (ref 15–41)
Albumin: 4.2 g/dL (ref 3.5–5.0)
Alkaline Phosphatase: 81 U/L (ref 38–126)
Bilirubin, Direct: <0.1 mg/dL (ref 0.0–0.2)
Total Bilirubin: 0.2 mg/dL (ref 0.0–1.2)
Total Protein: 7.1 g/dL (ref 6.5–8.1)

## 2024-05-01 LAB — BASIC METABOLIC PANEL WITH GFR
Anion gap: 12 (ref 5–15)
BUN: 14 mg/dL (ref 6–20)
CO2: 21 mmol/L — ABNORMAL LOW (ref 22–32)
Calcium: 8.8 mg/dL — ABNORMAL LOW (ref 8.9–10.3)
Chloride: 104 mmol/L (ref 98–111)
Creatinine, Ser: 0.81 mg/dL (ref 0.61–1.24)
GFR, Estimated: >60 mL/min (ref >60)
Glucose, Bld: 97 mg/dL (ref 70–99)
Potassium: 3.9 mmol/L (ref 3.5–5.1)
Sodium: 137 mmol/L (ref 135–145)

## 2024-05-01 LAB — URINALYSIS, ROUTINE W REFLEX MICROSCOPIC
Bilirubin Urine: NEGATIVE
Glucose, UA: NEGATIVE mg/dL
Hgb urine dipstick: NEGATIVE
Ketones, ur: NEGATIVE mg/dL
Leukocytes,Ua: NEGATIVE
Nitrite: NEGATIVE
Protein, ur: NEGATIVE mg/dL
Specific Gravity, Urine: 1.025 (ref 1.005–1.030)
pH: 6.5 (ref 5.0–8.0)

## 2024-05-01 LAB — TROPONIN T, HIGH SENSITIVITY
Troponin T High Sensitivity: <15 ng/L (ref <19)
Troponin T High Sensitivity: <15 ng/L (ref <19)

## 2024-05-01 LAB — D-DIMER, QUANTITATIVE: D-Dimer, Quant: <0.27 ug{FEU}/mL (ref 0.00–0.50)

## 2024-05-01 MED ORDER — KETOROLAC TROMETHAMINE 30 MG/ML IJ SOLN
15.0000 mg | Freq: Once | INTRAMUSCULAR | Status: AC
  Administered 2024-05-01: 15 mg via INTRAVENOUS
  Filled 2024-05-01: qty 1

## 2024-05-01 NOTE — ED Provider Notes (Signed)
 [place] [facility] Provider Note   CSN: [id number] Arrival date & time: 04/30/24  2301     Patient presents with: No chief complaint on file.   Justin [name] is a 38 y.o. male.   The history is provided by the patient.  Justin [name] is a 38 y.o. male who presents to the Emergency Department complaining of back pain.  He presents to the emergency department for evaluation of back pain for the last 2 months, greatest on the right than the left.  He describes it as a tightening sensation.  He did go to urgent care earlier in the week and was started on naproxen with no significant change in symptoms.  No difference with palpation.  Pain is worse when he breathes or coughs.  No fever, nausea, vomiting, shortness of breath, abdominal pain, change in urine, leg swelling or pain.  No night sweats or weight loss.  He has no medical history.  No reported injuries.  No family medical hx.   No tobacco, alcohol, drugs.         Prior to Admission medications   Medication Sig Start Date End Date Taking? Authorizing Provider  lisdexamfetamine (VYVANSE ) 20 MG capsule Take 1 capsule (20 mg total) by mouth daily. 02/10/24     lisdexamfetamine (VYVANSE ) 30 MG capsule Take 1 capsule (30 mg total) by mouth daily. 04/06/24       Allergies: Patient has no known allergies.    Review of Systems  All other systems reviewed and are negative.   Updated Vital Signs BP 128/78 (BP Location: Right Arm)   Pulse 88   Temp 98.1 F (36.7 C) (Oral)   Resp 18   Ht 6' 3 (1.905 m)   Wt 131.5 kg   SpO2 100%   BMI 36.25 kg/m   Physical Exam Vitals and nursing note reviewed.  Constitutional:      Appearance: He is well-developed.  HENT:     Head: Normocephalic and atraumatic.   Cardiovascular:     Rate and Rhythm: Normal rate and regular rhythm.     Heart sounds: No murmur heard. Pulmonary:     Effort: Pulmonary effort is normal. No respiratory distress.      Breath sounds: Normal breath sounds.  Abdominal:     Palpations: Abdomen is soft.     Tenderness: There is no abdominal tenderness. There is no guarding or rebound.   Musculoskeletal:        General: No tenderness.   Skin:    General: Skin is warm and dry.   Neurological:     Mental Status: He is alert and oriented to person, place, and time.     Comments: 5/5 strength in BLE  Psychiatric:        Behavior: Behavior normal.     (all labs ordered are listed, but only abnormal results are displayed) Labs Reviewed  BASIC METABOLIC PANEL WITH GFR - Abnormal; Notable for the following components:      Result Value   CO2 21 (*)    Calcium 8.8 (*)    All other components within normal limits  HEPATIC FUNCTION PANEL - Abnormal; Notable for the following components:   ALT 48 (*)    All other components within normal limits  CBC  D-DIMER, QUANTITATIVE  URINALYSIS, ROUTINE W REFLEX MICROSCOPIC  TROPONIN T, HIGH SENSITIVITY  TROPONIN T, HIGH SENSITIVITY    EKG: EKG Interpretation Date/Time:  Friday April 30 2024 23:45:32 EDT Ventricular  Rate:  82 PR Interval:  162 QRS Duration:  90 QT Interval:  361 QTC Calculation: 422 R Axis:   -7  Text Interpretation: Sinus rhythm Abnormal R-wave progression, early transition Left ventricular hypertrophy Confirmed by Kelsey Patricia [phone]) on 05/01/2024 12:09:49 AM  Radiology: Lenell Query Chest 2 View Result Date: 05/01/2024 CLINICAL DATA:  Right side and mid back discomfort x several days; seen at [facility] for same. Non smoker. No cough or fever EXAM: CHEST - 2 VIEW COMPARISON:  Chest x-ray 09/09/2008 FINDINGS: The heart and mediastinal contours are within normal limits. No focal consolidation. No pulmonary edema. No pleural effusion. No pneumothorax. No acute osseous abnormality. IMPRESSION: No active cardiopulmonary disease. Electronically Signed   By: Morgane  Naveau M.D.   On: 05/01/2024 00:19     Procedures   Medications Ordered in the ED   ketorolac (TORADOL) 30 MG/ML injection 15 mg (15 mg Intravenous Given 05/01/24 0135)                                    Medical Decision Making Amount and/or Complexity of Data Reviewed Labs: ordered. Radiology: ordered.  Risk Prescription drug management.   Patient here for evaluation of atraumatic bilateral back pain, right greater than left for 2 months.  No reproducible tenderness on examination.  No focal neurologic deficits or red flags.  Labs are unremarkable with normal renal function, hepatic function, urinalysis.  EKG is nonischemic and troponins are negative x 2.  Doubt PE, D-dimer is negative and patient without risk factors.  Discussed with patient unclear source of symptoms.  He was treated with ketorolac in the emergency department with improvement in symptoms.  Discussed ongoing plan for OTC analgesics with PCP follow-up and return precautions.     Final diagnoses:  Mid back pain on right side    ED Discharge Orders     None          Kelsey Patricia, MD 05/01/24 [phone]

## 2024-05-10 ENCOUNTER — Ambulatory Visit: Admitting: Psychology

## 2024-05-10 DIAGNOSIS — F439 Reaction to severe stress, unspecified: Secondary | ICD-10-CM | POA: Diagnosis not present

## 2024-05-10 DIAGNOSIS — Z630 Problems in relationship with spouse or partner: Secondary | ICD-10-CM | POA: Diagnosis not present

## 2024-05-10 NOTE — Progress Notes (Signed)
 [facility] Counselor/Therapist Progress Note  Patient ID: Justin [name], MRN: [id number]   Date: 05/10/24  Time Spent: 1:00 pm - 1: 53 pm  :  53 Minutes  Treatment Type: Individual Therapy.  Reported Symptoms: Anxiety: Feeling nervous, anxious or on edge, not being able to stop or control worrying, worrying too much about different things, trouble relaxing, becoming easily annoyed or irritable, feeling afraid as if something awful might happen. Depression: little interest or pleasure in doing things, feeling down, depressed, or hopeless, trouble falling or staying asleep, or sleeping too much, feeling tired or having little energy, poor appetite or overeating, Feeling bad about yourself--or that you are a failure or have let yourself or your family down, trouble concentrating on things, such as reading the newspaper or watching television, moving or speaking so slowly that other people could have noticed. Or the opposite, being so fidgety or restless that you have been moving around a lot more than usual.   Mental Status Exam: Appearance:  Casual     Behavior: Appropriate  Motor: Normal  Speech/Language:  Normal Rate  Affect: Appropriate  Mood: normal  Thought process: normal  Thought content:   WNL  Sensory/Perceptual disturbances:   WNL  Orientation: oriented to person, place, and time/date  Attention: Good  Concentration: Good  Memory: WNL  Fund of knowledge:  Good  Insight:   Good  Judgment:  Good  Impulse Control: Good   Risk Assessment: Danger to Self:  No Self-injurious Behavior: No Danger to Others: No Duty to Warn:no Physical Aggression / Violence:No  Access to Firearms a concern: No  Gang Involvement:No   Subjective:   [name] participated from home, via video and consented to treatment. Therapist participated from home office. I discussed the limitations of evaluation and management by telemedicine and the availability of in person  appointments. The patient expressed understanding and agreed to proceed. Davaughn reviewed the events of the past week.   Patient continues to deal with marital issues with his wife, [name]. Patient talked about his sister Rona growing up and how she was so bold that he felt the need to shrink. Sister Rona accused the patient of not having a backbone.   We reviewed numerous treatment approaches including CBT, BA, Problem Solving, and Solution focused therapy.   Psych-education regarding the Bladen's diagnosis of Stress due to marital problems [Z63.0]  was provided during the session.   We discussed Markees Enberg's goals treatment goals which include:   Expressing myself A.  I'm not the kind of person to be open with people B.  Gave example of what La Shonda's behavior is like, she'll be 41 in the near future and she has very little of a filter. She will say things that will hurt you in your soul/to be as mean as possible. Ex: Patient talked about his sister Rona growing up and how she was so bold that he felt the need to shrink. Sister Rona accused the patient of not having a backbone.    Have a life outside of work   Humzah Trant provided verbal approval of the treatment plan.   Interventions: Psycho-education & Goal Setting.   Diagnosis:  Stress due to marital problems [Z63.0]   Psychiatric Treatment: No   Treatment Plan:  Client Abilities/Strengths Patient is bright, motivated, and committed to doing what is best for his family.   Support System: Teran reported that his Genia is the only person he can trust.   Scientist, research (physical sciences)  OPT  Client Statement of Needs Eddrick would like to express himself more effectively and have a life outside of work.    Treatment Level Biweekly/Monthly  Symptoms   Anxiety: Feeling nervous, anxious or on edge, not being able to stop or control worrying, worrying too much about different things,  trouble relaxing, becoming easily annoyed or irritable, feeling afraid as if something awful might happen. Depression: little interest or pleasure in doing things, feeling down, depressed, or hopeless, trouble falling or staying asleep, or sleeping too much, feeling tired or having little energy, poor appetite or overeating, Feeling bad about yourself--or that you are a failure or have let yourself or your family down, trouble concentrating on things, such as reading the newspaper or watching television, moving or speaking so slowly that other people could have noticed. Or the opposite, being so fidgety or restless that you have been moving around a lot more than usual.     Goals:   Plumer experiences symptoms of anxiety and stress.   Treatment plan signed and available on s-drive:  No    Target Date: 04/09/25 Frequency: Biweekly/Monthly  Progress: 0 Modality: individual    Therapist will provide referrals for additional resources as appropriate.  Therapist will provide psycho-education regarding Dillard's diagnosis and corresponding treatment approaches and interventions. [name] will support the patient's ability to achieve the goals identified. will employ CBT, BA, Problem-solving, Solution Focused, Mindfulness,  coping skills, & other evidenced-based practices will be used to promote progress towards healthy functioning to help manage decrease symptoms associated with their diagnosis.   Reduce overall level, frequency, and intensity of the feelings of depression, anxiety and panic evidenced by decreased overall symptoms from 6 to 7 days/week to 0 to 1 days/week per client report for at least 3 consecutive months. Verbally express understanding of the relationship between feelings of depression, anxiety and their impact on thinking patterns and behaviors. Verbalize an understanding of the role that distorted thinking plays in creating fears, excessive worry, and  ruminations.    (Kevonta participated in the creation of the treatment plan)    [name]

## 2024-05-12 ENCOUNTER — Other Ambulatory Visit (HOSPITAL_BASED_OUTPATIENT_CLINIC_OR_DEPARTMENT_OTHER): Payer: Self-pay

## 2024-05-12 MED ORDER — LISDEXAMFETAMINE DIMESYLATE 30 MG PO CAPS
30.0000 mg | ORAL_CAPSULE | Freq: Every day | ORAL | 0 refills | Status: DC
  Filled 2024-05-12: qty 30, 30d supply, fill #0

## 2024-05-28 ENCOUNTER — Encounter: Admitting: Psychology

## 2024-05-28 NOTE — Progress Notes (Signed)
 This encounter was created in error - please disregard.

## 2024-05-28 NOTE — Progress Notes (Deleted)
                Justin [name]

## 2024-06-13 ENCOUNTER — Other Ambulatory Visit (HOSPITAL_BASED_OUTPATIENT_CLINIC_OR_DEPARTMENT_OTHER): Payer: Self-pay

## 2024-06-14 ENCOUNTER — Other Ambulatory Visit (HOSPITAL_BASED_OUTPATIENT_CLINIC_OR_DEPARTMENT_OTHER): Payer: Self-pay

## 2024-06-14 ENCOUNTER — Other Ambulatory Visit: Payer: Self-pay

## 2024-06-14 MED ORDER — LISDEXAMFETAMINE DIMESYLATE 30 MG PO CAPS
30.0000 mg | ORAL_CAPSULE | Freq: Every day | ORAL | 0 refills | Status: DC
  Filled 2024-06-14: qty 30, 30d supply, fill #0

## 2024-06-23 ENCOUNTER — Ambulatory Visit: Admitting: Psychology

## 2024-06-23 DIAGNOSIS — Z630 Problems in relationship with spouse or partner: Secondary | ICD-10-CM

## 2024-06-23 DIAGNOSIS — F439 Reaction to severe stress, unspecified: Secondary | ICD-10-CM | POA: Diagnosis not present

## 2024-06-23 NOTE — Progress Notes (Unsigned)
 [facility] Counselor/Therapist Progress Note  Patient ID: Monica Zahler, MRN: [id number]    Date: 06/23/24  Time Spent: 9:02 am - 9:55 am :  53 minutes  Treatment Type: Individual Therapy.  Reported Symptoms: Anxiety and Depression  Mental Status Exam: Appearance:  Casual     Behavior: Appropriate  Motor: Normal  Speech/Language:  Normal Rate  Affect: Appropriate  Mood: normal  Thought process: normal  Thought content:   WNL  Sensory/Perceptual disturbances:   WNL  Orientation: oriented to person, place, and time/date  Attention: Good  Concentration: Good  Memory: WNL  Fund of knowledge:  Good  Insight:   Good  Judgment:  Good  Impulse Control: Good   Risk Assessment: Danger to Self:  No Self-injurious Behavior: No Danger to Others: No Duty to Warn:no Physical Aggression / Violence:No  Access to Firearms a concern: No  Gang Involvement:No   Subjective:   Tayler Sardo participated from home, via video, and consented to treatment. I discussed the limitations of evaluation and management by telemedicine and the availability of in person appointments. The patient expressed understanding and agreed to proceed.  Therapist participated from home office.  Heman reviewed the events of the past week.   [name] reported that something happened recently with his wife that caused her to want to seek counseling.  [facility], patient's wife, will be starting to see an individual therapist on 8/14. Patient reported that both he and [name] want to make the marriage work.  Patient reports his wife saying that he (pt) is getting to the point where she has been for a while. Example, wife feels like patient has pulled back from her in the past, now patient's wife is pulling back from patient. Not dealing with lust issues.  35% wife responsible>>>65%  [name]  Interventions: Cognitive Behavioral Therapy  Diagnosis: Stress due to marital problems [Z63.0]   Psychiatric  Treatment: No   Treatment Plan:  Client Abilities/Strengths Aizen is bright, motivated, and committed to doing what is best for his family.   Support System: Arend reported that his Genia is the only person he can trust..  Client Treatment Preferences OPT  Client Statement of Needs Eris would like to Vontrell reported that his Bishop is the only person he can trust.    Treatment Level Biweekly  Symptoms Anxiety and Depression Goals:      Target Date: *** Frequency: {Frequency of sessions.:26745}  Progress: 0 Modality: individual    Therapist will provide referrals for additional resources as appropriate.  Therapist will provide psycho-education regarding Viggo's diagnosis and corresponding treatment approaches and interventions. [name] will support the patient's ability to achieve the goals identified. will employ CBT, BA, Problem-solving, Solution Focused, Mindfulness,  coping skills, & other evidenced-based practices will be used to promote progress towards healthy functioning to help manage decrease symptoms associated with {his/her/their:21314} diagnosis.   Reduce overall level, frequency, and intensity of the feelings of depression, anxiety and panic evidenced by decreased *** from 6 to 7 days/week to 0 to 1 days/week per client report for at least 3 consecutive months. Verbally express understanding of the relationship between feelings of ***depression, ***anxiety and their impact on thinking patterns and behaviors. Verbalize an understanding of the role that distorted thinking plays in creating fears, excessive worry, and ruminations.  (Alireza participated in the creation of the treatment plan)   [name] Jenkins CHRISTELLA  Maudie

## 2024-07-02 ENCOUNTER — Encounter: Admitting: Psychology

## 2024-07-02 NOTE — Progress Notes (Signed)
 This encounter was created in error - please disregard.

## 2024-07-02 NOTE — Progress Notes (Deleted)
                Justin [name]

## 2024-07-08 ENCOUNTER — Ambulatory Visit (INDEPENDENT_AMBULATORY_CARE_PROVIDER_SITE_OTHER): Admitting: Psychology

## 2024-07-08 DIAGNOSIS — F439 Reaction to severe stress, unspecified: Secondary | ICD-10-CM

## 2024-07-08 DIAGNOSIS — Z630 Problems in relationship with spouse or partner: Secondary | ICD-10-CM | POA: Diagnosis not present

## 2024-07-08 NOTE — Progress Notes (Signed)
 [facility] Counselor/Therapist Progress Note  Patient ID: Jamin Panther, MRN: [id number]    Date: 07/08/24  Time Spent: 12:00 pm - 12:55 pm : 55 minutes   Treatment Type: Individual Therapy.  Reported Symptoms: Anxiety and Depression  Mental Status Exam: Appearance:  Casual     Behavior: Appropriate  Motor: Normal  Speech/Language:  Normal Rate  Affect: Appropriate  Mood: normal  Thought process: normal  Thought content:   WNL  Sensory/Perceptual disturbances:   WNL  Orientation: oriented to person, place, and time/date  Attention: Good  Concentration: Good  Memory: WNL  Fund of knowledge:  Good  Insight:   Good  Judgment:  Good  Impulse Control: Good   Risk Assessment: Danger to Self:  No Self-injurious Behavior: No Danger to Others: No Duty to Warn:no Physical Aggression / Violence:No  Access to Firearms a concern: No  Gang Involvement:No   Subjective:   Gurtej Rials participated from home, via video, and consented to treatment. I discussed the limitations of evaluation and management by telemedicine and the availability of in person appointments. The patient expressed understanding and agreed to proceed.  Therapist participated from home office.  Kaidin reviewed the events of the past week.   Patient related a stressful situation that happened between him and his wife, [name].  Interventions: Cognitive Behavioral Therapy  Diagnosis: Stress due to marital problems [Z63.0]   Psychiatric Treatment: No  Treatment Plan:  Client Abilities/Strengths Daekwon is bright, motivated, and committed to doing what is best for his family.   Support System: [name] reported that his Genia is the only person he can trust.   Client Treatment Preferences OPT  Client Statement of Needs Dionis would like to express himself more effectively and have a life outside of work.   Treatment Level Biweekly  Symptoms  Alvis experiences  symptoms of anxiety and stress.   Goals:   Miller experiences symptoms of anxiety and stress.   Target Date: 04/09/25 Frequency: Biweekly  Progress: 0 Modality: individual    Therapist will provide referrals for additional resources as appropriate.  Therapist will provide psycho-education regarding Jovaughn's diagnosis and corresponding treatment approaches and interventions. [name] will support the patient's ability to achieve the goals identified. will employ CBT, BA, Problem-solving, Solution Focused, Mindfulness,  coping skills, & other evidenced-based practices will be used to promote progress towards healthy functioning to help manage decrease symptoms associated with their diagnosis.   Reduce overall level, frequency, and intensity of the feelings of depression, anxiety and panic evidenced by decreased overall symptoms from 6 to 7 days/week to 0 to 1 days/week per client report for at least 3 consecutive months. Verbally express understanding of the relationship between feelings of anxiety and their impact on thinking patterns and behaviors. Verbalize an understanding of the role that distorted thinking plays in creating fears, excessive worry, and ruminations.  (Nyquan participated in the creation of the treatment plan)   [name]

## 2024-07-11 ENCOUNTER — Other Ambulatory Visit (HOSPITAL_BASED_OUTPATIENT_CLINIC_OR_DEPARTMENT_OTHER): Payer: Self-pay

## 2024-07-13 ENCOUNTER — Other Ambulatory Visit (HOSPITAL_BASED_OUTPATIENT_CLINIC_OR_DEPARTMENT_OTHER): Payer: Self-pay

## 2024-07-13 MED ORDER — LISDEXAMFETAMINE DIMESYLATE 30 MG PO CAPS
30.0000 mg | ORAL_CAPSULE | Freq: Every day | ORAL | 0 refills | Status: DC
  Filled 2024-07-14: qty 30, 30d supply, fill #0

## 2024-07-14 ENCOUNTER — Other Ambulatory Visit (HOSPITAL_BASED_OUTPATIENT_CLINIC_OR_DEPARTMENT_OTHER): Payer: Self-pay

## 2024-07-15 ENCOUNTER — Other Ambulatory Visit (HOSPITAL_BASED_OUTPATIENT_CLINIC_OR_DEPARTMENT_OTHER): Payer: Self-pay

## 2024-07-16 ENCOUNTER — Ambulatory Visit (INDEPENDENT_AMBULATORY_CARE_PROVIDER_SITE_OTHER): Admitting: Psychology

## 2024-07-16 DIAGNOSIS — F439 Reaction to severe stress, unspecified: Secondary | ICD-10-CM | POA: Diagnosis not present

## 2024-07-16 DIAGNOSIS — Z630 Problems in relationship with spouse or partner: Secondary | ICD-10-CM

## 2024-07-16 NOTE — Progress Notes (Signed)
 [facility] Counselor/Therapist Progress Note  Patient ID: Justin [name], MRN: [id number]    Date: 07/16/24  Time Spent: 5:00 pm - 5:53 pm : 53 minutes     Treatment Type: Individual Therapy.  Reported Symptoms: Anxiety and Depression  Mental Status Exam: Appearance:  Casual     Behavior: Appropriate  Motor: Normal  Speech/Language:  Normal Rate  Affect: Appropriate  Mood: normal  Thought process: normal  Thought content:   WNL  Sensory/Perceptual disturbances:   WNL  Orientation: oriented to person, place, and time/date  Attention: Good  Concentration: Good  Memory: WNL  Fund of knowledge:  Good  Insight:   Good  Judgment:  Good  Impulse Control: Good   Risk Assessment: Danger to Self:  No Self-injurious Behavior: No Danger to Others: No Duty to Warn:no Physical Aggression / Violence:No  Access to Firearms a concern: No  Gang Involvement:No   Subjective:   Justin [name] participated from home, via video, and consented to treatment. I discussed the limitations of evaluation and management by telemedicine and the availability of in person appointments. The patient expressed understanding and agreed to proceed Therapist participated from home office. Justin [name] reviewed the events of the past week.   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 Patient is taking steps to manage his stress regarding the marriage.    Interventions: Cognitive Behavioral Therapy  Diagnosis: Stress due to marital problems [Z63.0]   Psychiatric Treatment: No   Treatment Plan:  Client Abilities/Strengths Justin [name] is bright and committed to doing what is best for his family.  Support System: His Bishop  Client Treatment Preferences OPT  Client Statement of Needs Justin [name] would like to express himself more effectively and have a life outside of work.    Treatment  Level Biweekly  Symptoms  Anxiety and stress  Goals:   Target Date: 04/09/25 Frequency: Biweekly  Progress: 15% Modality: individual    Therapist will provide referrals for additional resources as appropriate.  Therapist will provide psycho-education regarding Justin [name]'s diagnosis and corresponding treatment approaches and interventions. Justin [name] will support the patient's ability to achieve the goals identified. will employ CBT, BA, Problem-solving, Solution Focused, Mindfulness,  coping skills, & other evidenced-based practices will be used to promote progress towards healthy functioning to help manage decrease symptoms associated with their diagnosis.   Reduce overall level, frequency, and intensity of the feelings of depression, anxiety and panic evidenced by decreased overall symptoms from 6 to 7 days/week to 0 to 1 days/week per client report for at least 3 consecutive months. Verbally express understanding of the relationship between feelings of anxiety and their impact on thinking patterns and behaviors. Verbalize an understanding of the role that distorted thinking plays in creating fears, excessive worry, and ruminations.  ([name] participated in the creation of the treatment plan)    Justin [name]

## 2024-07-26 ENCOUNTER — Ambulatory Visit (INDEPENDENT_AMBULATORY_CARE_PROVIDER_SITE_OTHER): Admitting: Psychology

## 2024-07-26 DIAGNOSIS — Z630 Problems in relationship with spouse or partner: Secondary | ICD-10-CM | POA: Diagnosis not present

## 2024-07-26 DIAGNOSIS — F439 Reaction to severe stress, unspecified: Secondary | ICD-10-CM

## 2024-07-26 NOTE — Progress Notes (Signed)
 [facility] Counselor/Therapist Progress Note  Patient ID: Justin [name], MRN: [id number]    Date: 07/26/24  Time Spent: 11:01 am - 11:54 am - 53 minutes    Treatment Type: Individual Therapy.  Reported Symptoms: Anxiety and Depression  Mental Status Exam: Appearance:  NA     Behavior: Appropriate  Motor: Normal  Speech/Language:  Normal Rate  Affect: Appropriate  Mood: normal  Thought process: normal  Thought content:   WNL  Sensory/Perceptual disturbances:   WNL  Orientation: oriented to person, place, and time/date  Attention: Good  Concentration: Good  Memory: WNL  Fund of knowledge:  Good  Insight:   Good  Judgment:  Good  Impulse Control: Good   Risk Assessment: Danger to Self:  No Self-injurious Behavior: No Danger to Others: No Duty to Warn:no Physical Aggression / Violence:No  Access to Firearms a concern: No  Gang Involvement:No   Subjective:   Justin [name] participated from home, via video, and consented to treatment. I discussed the limitations of evaluation and management by telemedicine and the availability of in person appointments. The patient expressed understanding and agreed to proceed.  Therapist participated from home office.  Justin [name] reviewed the events of the past week.   Patient is no longer interested in going to marital therapy because his wife is no long interested in going to marital therapy. Patient described himself as having a love language of affirmations, and his wife having a love language of service. Patient is working on decreasing his worrying when it comes to communicating with his wife. Patient discussed similarities between Justin [name], and his wife, Justin [name]  Interventions: Cognitive Behavioral Therapy  Diagnosis: Stress due to marital problems (Z63.0)  Psychiatric Treatment: No   Treatment Plan:  Client Abilities/Strengths Justin [name] is bright and committed to doing what is best for his family.    Support System: His Bishop  Client Treatment Preferences OPT  Client Statement of Needs Justin [name] would like to express himself more effectively and have a life outside of work.    Treatment Level Biweekly  Symptoms  Justin [name] experiences symptoms of anxiety and stress.  Goals:   Target Date: 04/09/25 Frequency: Biweekly  Progress: 20% Modality: individual    Therapist will provide referrals for additional resources as appropriate.  Therapist will provide psycho-education regarding Irl's diagnosis and corresponding treatment approaches and interventions. [name] will support the patient's ability to achieve the goals identified. will employ CBT, BA, Problem-solving, Solution Focused, Mindfulness,  coping skills, & other evidenced-based practices will be used to promote progress towards healthy functioning to help manage decrease symptoms associated with their diagnosis.   Reduce overall level, frequency, and intensity of the feelings of depression, anxiety and panic evidenced by decreased overall symptoms from 6 to 7 days/week to 0 to 1 days/week per client report for at least 3 consecutive months. Verbally express understanding of the relationship between feelings of anxiety and their impact on thinking patterns and behaviors. Verbalize an understanding of the role that distorted thinking plays in creating fears, excessive worry, and ruminations.  (Dijuan participated in the creation of the treatment plan)    [name]

## 2024-08-06 ENCOUNTER — Ambulatory Visit (INDEPENDENT_AMBULATORY_CARE_PROVIDER_SITE_OTHER): Admitting: Psychology

## 2024-08-06 DIAGNOSIS — F439 Reaction to severe stress, unspecified: Secondary | ICD-10-CM

## 2024-08-06 DIAGNOSIS — Z630 Problems in relationship with spouse or partner: Secondary | ICD-10-CM | POA: Diagnosis not present

## 2024-08-06 DIAGNOSIS — F419 Anxiety disorder, unspecified: Secondary | ICD-10-CM

## 2024-08-06 NOTE — Progress Notes (Signed)
 [facility] Counselor/Therapist Progress Note  Patient ID: Justin [name], MRN: [id number]    Date: 08/06/24  Time Spent: 4:04 pm- 4:57 pm: 53 minutes (There were technology issues so we started a few minutes late.)  Treatment Type: Individual Therapy.  Reported Symptoms: Anxiety: Feeling nervous, anxious or on edge, not being able to stop or control worrying, worrying too much about different things, trouble relaxing, becoming easily annoyed or irritable, feeling afraid as if something awful might happen. Depression: little interest or pleasure in doing things, feeling down, depressed, or hopeless, trouble falling or staying asleep, or sleeping too much, feeling tired or having little energy, poor appetite or overeating, Feeling bad about yourself--or that you are a failure or have let yourself or your family down, trouble concentrating on things, such as reading the newspaper or watching television, moving or speaking so slowly that other people could have noticed. Or the opposite, being so fidgety or restless that you have been moving around a lot more than usual.   Mental Status Exam Appearance:  NA     Behavior: Appropriate  Motor: Normal  Speech/Language:  Normal Rate  Affect: Negative  Mood: depressed  Thought process: normal  Thought content:   WNL  Sensory/Perceptual disturbances:   WNL  Orientation: oriented to person, place, and time/date  Attention: Good  Concentration: Good  Memory: WNL  Fund of knowledge:  Good  Insight:   Good  Judgment:  Good  Impulse Control: Good   Risk Assessment: Danger to Self:  No Self-injurious Behavior: No Danger to Others: No Duty to Warn:no Physical Aggression / Violence:No  Access to Firearms a concern: No  Gang Involvement:No   Subjective:   Justin [name] participated from home, via video, and consented to treatment. I discussed the limitations of evaluation and management by telemedicine and the availability of in  person appointments. The patient expressed understanding and agreed to proceed.  Therapist participated from home office.  Atom reviewed the events of the past week.   Patient reported  feeling depressed about his marriage possibly ending and and anxious about being alone.  The goals below are true for if the marriage gets back together, AND if not, the goals will be true for patient being divorced and moving on in his life.   Express myself more Be more open with people Have a life outside of work  We will continue to work on increasing his energy level and practicing stress management techniques.    Interventions: Cognitive Behavioral Therapy  Diagnosis: Stress due to marital problems (Z63.0)  Psychiatric Treatment: No   Treatment Plan:  Client Abilities/Strengths Stoy is bright and committed to doing what is best for his family.   Support System: His Bishop  Client Treatment Preferences OPT  Client Statement of Needs Gilles would like to express himself more effectively and have a life outside of work.   Treatment Level Biweekly  Symptoms  [name] experiences symptoms of anxiety and depression with regard to the stress he feels in his marriage.    Goals:   Target Date: 04/09/25 Frequency: Biweekly  Progress: 0 Modality: individual    Therapist will provide referrals for additional resources as appropriate.  Therapist will provide psycho-education regarding Hazem's diagnosis and corresponding treatment approaches and interventions. [name] will support the patient's ability to achieve the goals identified. will employ CBT, BA, Problem-solving, Solution Focused, Mindfulness,  coping skills, & other evidenced-based practices will be used to promote progress towards healthy functioning to help  manage decrease symptoms associated with their diagnosis.   Reduce overall level, frequency, and intensity of the feelings of depression, anxiety and panic  evidenced by decreased overall symptoms from 6 to 7 days/week to 0 to 1 days/week per client report for at least 3 consecutive months. Verbally express understanding of the relationship between feelings of depression and anxiety and their impact on thinking patterns and behaviors. Verbalize an understanding of the role that distorted thinking plays in creating fears, excessive worry, and ruminations.  (Starlin participated in the creation of the treatment plan)    [name]

## 2024-08-12 ENCOUNTER — Other Ambulatory Visit (HOSPITAL_BASED_OUTPATIENT_CLINIC_OR_DEPARTMENT_OTHER): Payer: Self-pay

## 2024-08-12 ENCOUNTER — Other Ambulatory Visit: Payer: Self-pay

## 2024-08-12 MED ORDER — LISDEXAMFETAMINE DIMESYLATE 30 MG PO CAPS
30.0000 mg | ORAL_CAPSULE | Freq: Every day | ORAL | 0 refills | Status: DC
  Filled 2024-08-12: qty 30, 30d supply, fill #0

## 2024-08-13 ENCOUNTER — Ambulatory Visit: Admitting: Psychology

## 2024-08-13 DIAGNOSIS — F439 Reaction to severe stress, unspecified: Secondary | ICD-10-CM

## 2024-08-13 DIAGNOSIS — Z630 Problems in relationship with spouse or partner: Secondary | ICD-10-CM

## 2024-08-13 NOTE — Progress Notes (Signed)
 [facility] Counselor/Therapist Progress Note  Patient ID: Justin [name], MRN: [id number]    Date: 08/13/24  Time Spent: 5:01 pm - 5:54 pm : 53 minutes  Treatment Type: Individual Therapy.  Reported Symptoms/Chief Complaint: Patient reported that this past week has led to a roller coaster of emotions for him.  Mental Status Exam: Appearance:  NA     Behavior: Appropriate  Motor: Normal  Speech/Language:  Normal Rate  Affect: Appropriate  Mood: normal  Thought process: normal  Thought content:   WNL  Sensory/Perceptual disturbances:   WNL  Orientation: oriented to person, place, and time/date  Attention: Good  Concentration: Good  Memory: WNL  Fund of knowledge:  Good  Insight:   Good  Judgment:  Good  Impulse Control: Good   Risk Assessment: Danger to Self:  No Self-injurious Behavior: No Danger to Others: No Duty to Warn:no Physical Aggression / Violence:No  Access to Firearms a concern: No  Gang Involvement:No   Subjective:   Justin [name] participated from home, via video, and consented to treatment. I discussed the limitations of evaluation and management by telemedicine and the availability of in person appointments. The patient expressed understanding and agreed to proceed.  Therapist participated from home office.  Justin [name] reviewed the events of the past week.   Patient reported that he was tearful this past this week when it hit him. Patient and his wife are in the process of packing up their house to be sold, then they will split the cost of the house once it is sold. Patient's wife already has a new place to live. Anger and disappointment were the theme throughout today's session. Patient verbalizes the need for self-care now and in the future.   Interventions: Cognitive Behavioral Therapy  Diagnosis: Stress due to marital problems (Z63.0)  Psychiatric Treatment: No   Treatment Plan:  Client Abilities/Strengths Justin [name] is bright and  committed to doing what is best for his family.   Support System: His Bishop  Client Treatment Preferences OPT  Client Statement of Needs Justin [name] would like to express himself more effectively and have a life outside of work   Treatment Level Biweekly  Symptoms  Justin [name] experiences symptoms of anxiety and depression with regard to the stress he feels in his marriage.   Goals:   Target Date: 04/09/25 Frequency: Biweekly  Progress: 15% Modality: individual    Therapist will provide referrals for additional resources as appropriate.  Therapist will provide psycho-education regarding Justin [name]'s diagnosis and corresponding treatment approaches and interventions. Justin [name] [name] will support the patient's ability to achieve the goals identified. will employ CBT, BA, Problem-solving, Solution Focused, Mindfulness,  coping skills, & other evidenced-based practices will be used to promote progress towards healthy functioning to help manage decrease symptoms associated with their diagnosis.   Reduce overall level, frequency, and intensity of the feelings of depression, anxiety and panic evidenced by decreased overall symptoms from 6 to 7 days/week to 0 to 1 days/week per client report for at least 3 consecutive months. Verbally express understanding of the relationship between feelings of depression and anxiety and their impact on thinking patterns and behaviors. Verbalize an understanding of the role that distorted thinking plays in creating fears, excessive worry, and ruminations.  (Justin [name] participated in the creation of the treatment plan)    Justin [name] [name]

## 2024-08-19 ENCOUNTER — Telehealth: Payer: Self-pay | Admitting: Adult Health

## 2024-08-19 NOTE — Telephone Encounter (Signed)
 Patient requesting a compliance letter for DOT. Would like a call back.

## 2024-08-19 NOTE — Telephone Encounter (Signed)
 Nightly compliance is satisfactory but he needs to ensure he is using >4 hrs per night. I am not sure which one DOT requires to say he is compliant?

## 2024-08-19 NOTE — Telephone Encounter (Signed)
   Pulled compliance data for review by NP but patient still needs f/u appt scheduled asap with Harlene or Dr Buck. Patient due now for yearly visit.

## 2024-08-20 ENCOUNTER — Ambulatory Visit: Admitting: Psychology

## 2024-08-20 DIAGNOSIS — F439 Reaction to severe stress, unspecified: Secondary | ICD-10-CM | POA: Diagnosis not present

## 2024-08-20 DIAGNOSIS — Z630 Problems in relationship with spouse or partner: Secondary | ICD-10-CM | POA: Diagnosis not present

## 2024-08-20 NOTE — Progress Notes (Signed)
 [facility] Counselor/Therapist Progress Note  Patient ID: Justin [name], MRN: [id number]    Date: 08/20/24  Time Spent: 5:00 pm - 5:53 pm : 53 minutes  Treatment Type: Individual Therapy.  Reported Symptoms/Chief Complaint: Patient reported that he was frustrated and stressed by his major car issues that he just realized needed to be taken care of right away.   Mental Status Exam: Appearance:  NA     Behavior: Sharing  Motor: Normal  Speech/Language:  Clear and Coherent  Affect: Tearful  Mood: anxious  Thought process: normal  Thought content:   WNL  Sensory/Perceptual disturbances:   WNL  Orientation: oriented to person, place, thing  Attention: Good  Concentration: Good  Memory: WNL  Fund of knowledge:  Good  Insight:   Good  Judgment:  Good  Impulse Control: Good   Risk Assessment: Danger to Self:  No Self-injurious Behavior: No Danger to Others: No Duty to Warn:no Physical Aggression / Violence:No  Access to Firearms a concern: No  Gang Involvement:No   Subjective:   [name] participated from home, via video, and consented to treatment. I discussed the limitations of evaluation and management by telemedicine and the availability of in person appointments. The patient expressed understanding and agreed to proceed.  Therapist participated from home office.  [name] reviewed the events of the past week.   Patient reported that he was frustrated and stressed by his major car issues that he just realized needed to be taken care of right away. In addition, patient informed this Clinical research associate that he had to get their house fixed up and ready to sell by next Friday, 08/27/24. Patient and his wife have separated four times in the past but this time it feels devastating to the patient. We discussed the impact that both his behavior and his wife's behavior have on their past behavior and their future behavior.   Interventions: Cognitive Behavioral  Therapy  Diagnosis: Stress due to marital problems (Z63.0)  Psychiatric Treatment: No   Treatment Plan:  Client Abilities/Strengths [name] is bright and committed to doing what is best for his family.   Support System: His Bishop  Client Treatment Preferences OPT  Client Statement of Needs [name] would like to express himself more effectively and have a life outside of work.   Treatment Level Weekly  Symptoms  [name] experiences symptoms of anxiety and depression with regard to the stress he feels in his marriage.   Goals:   Target Date: 04/09/25 Frequency: Weekly  Progress: 25% Modality: individual    Therapist will provide referrals for additional resources as appropriate.  Therapist will provide psycho-education regarding [name]'s diagnosis and corresponding treatment approaches and interventions. [name] will support the patient's ability to achieve the goals identified. will employ CBT, BA, Problem-solving, Solution Focused, Mindfulness,  coping skills, & other evidenced-based practices will be used to promote progress towards healthy functioning to help manage decrease symptoms associated with their diagnosis.   Reduce overall level, frequency, and intensity of the feelings of depression, anxiety and panic evidenced by decreased overall symptoms from 6 to 7 days/week to 0 to 1 days/week per client report for at least 3 consecutive months. Verbally express understanding of the relationship between feelings of depression and anxiety and their impact on thinking patterns and behaviors. Verbalize an understanding of the role that distorted thinking plays in creating fears, excessive worry, and ruminations.  ([name] participated in the creation of the treatment plan)    [name]

## 2024-08-23 ENCOUNTER — Encounter: Payer: Self-pay | Admitting: *Deleted

## 2024-08-23 NOTE — Telephone Encounter (Signed)
 Pt is asking to be called with the status of the DOT compliance letter

## 2024-08-23 NOTE — Telephone Encounter (Signed)
 Spoke with patient. He believes 30 day review is fine for DOT. He understands he should increase his usage to >4 hours at night. He states this is affected by his work schedule but he will try to do improve. Pt scheduled a 15 min VV for 10/30 at 4 pm. He will r/s if unable to make appt. He understands he needs yearly f/u visits. He thanked me for the call.   Letter sent to mychart and reviewed with Harlene NP.

## 2024-08-27 ENCOUNTER — Ambulatory Visit (INDEPENDENT_AMBULATORY_CARE_PROVIDER_SITE_OTHER): Admitting: Psychology

## 2024-08-27 DIAGNOSIS — F439 Reaction to severe stress, unspecified: Secondary | ICD-10-CM | POA: Diagnosis not present

## 2024-08-27 DIAGNOSIS — Z630 Problems in relationship with spouse or partner: Secondary | ICD-10-CM | POA: Diagnosis not present

## 2024-08-27 NOTE — Progress Notes (Signed)
  [facility] Counselor/Therapist Progress Note  Patient ID: Justin [name], MRN: [id number]    Date: 08/27/24  Time Spent: 4:00 pm - 4:53 pm : 53 minutes    Treatment Type: Individual Therapy.  Reported Symptoms/Chief Complaint: Patient reported that he continues to be frustrated and stressed by his car issues, his house issues, and his wife moving out.   Mental Status Exam: Appearance:  NA     Behavior: Appropriate  Motor: Restlestness  Speech/Language:  Pressured  Affect: Tearful  Mood: sad  Thought process: normal  Thought content:   WNL  Sensory/Perceptual disturbances:   WNL  Orientation: oriented to person, place, and time/date  Attention: Good  Concentration: Good  Memory: WNL  Fund of knowledge:  Good  Insight:   Good  Judgment:  Good  Impulse Control: Good   Risk Assessment: Danger to Self:  No Self-injurious Behavior: No Danger to Others: No Duty to Warn:no Physical Aggression / Violence:No  Access to Firearms a concern: No  Gang Involvement:No   Subjective:   Justin [name] participated from home, via video, and consented to treatment. I discussed the limitations of evaluation and management by telemedicine and the availability of in person appointments. The patient expressed understanding and agreed to proceed.  Therapist participated from home office.  [name] reviewed the events of the past week.   Patient reported that his wife moved out this morning. Patient helped his wife, his brother in law, and his mother in law to move patient's wife's things out of their house. Patient plans to connect with his Bishop this weekend at church, and his Genia has always been there for patient. Patient feels down and and has very little energy. Patient is unable to relax and stop worrying.  Interventions: Cognitive Behavioral Therapy  Diagnosis: Stress due to marital problems (Z63.0)  Psychiatric Treatment: No   Treatment Plan:  Client  Abilities/Strengths Justin [name] is bright and committed to doing what is best for his family.   Support System: His Bishop  Client Treatment Preferences OPT  Client Statement of Needs Justin [name] would like to express himself more effectively and have a life outside of work.   Treatment Level Weekly  Symptoms  Justin [name] experiences symptoms of anxiety and depression with regard to the stress he feels in his marriage.  Goals:   Target Date: 5.23.26 Frequency: Weekly  Progress: 30% Modality: individual    Therapist will provide referrals for additional resources as appropriate.  Therapist will provide psycho-education regarding Justin [name] diagnosis and corresponding treatment approaches and interventions. Justin [name] will support the patient's ability to achieve the goals identified. will employ CBT, BA, Problem-solving, Solution Focused, Mindfulness,  coping skills, & other evidenced-based practices will be used to promote progress towards healthy functioning to help manage decrease symptoms associated with their diagnosis.   Reduce overall level, frequency, and intensity of the feelings of depression, anxiety and panic evidenced by decreased overall symptoms from 6 to 7 days/week to 0 to 1 days/week per client report for at least 3 consecutive months. Verbally express understanding of the relationship between feelings of depression and anxiety and their impact on thinking patterns and behaviors. Verbalize an understanding of the role that distorted thinking plays in creating fears, excessive worry, and ruminations.  (Justin [name] participated in the creation of the treatment plan)   Justin [name]

## 2024-09-03 ENCOUNTER — Ambulatory Visit (INDEPENDENT_AMBULATORY_CARE_PROVIDER_SITE_OTHER): Admitting: Psychology

## 2024-09-03 DIAGNOSIS — F32A Depression, unspecified: Secondary | ICD-10-CM | POA: Diagnosis not present

## 2024-09-03 DIAGNOSIS — F419 Anxiety disorder, unspecified: Secondary | ICD-10-CM | POA: Diagnosis not present

## 2024-09-03 NOTE — Progress Notes (Signed)
 [facility] Counselor/Therapist Progress Note  Patient ID: Justin [name], MRN: [id number]    Date: 09/03/24  Time Spent: 5:00 pm - 5:56 pm : 56 minutes     Treatment Type: Individual Therapy.  Reported Symptoms: Patient has spent one week alone in the house, while his wife and kids have spent the week   Mental Status Exam: Appearance:  NA     Behavior: Appropriate  Motor: Normal  Speech/Language:  Pressured  Affect: Depressed  Mood: anxious  Thought process: normal  Thought content:   WNL  Sensory/Perceptual disturbances:   WNL  Orientation: oriented to person, place, and time/date  Attention: Good  Concentration: Good  Memory: WNL  Fund of knowledge:  Good  Insight:   Good  Judgment:  Good  Impulse Control: Good   Risk Assessment: Danger to Self:  No Self-injurious Behavior: No Danger to Others: No Duty to Warn:no Physical Aggression / Violence:No  Access to Firearms a concern: No  Gang Involvement:No   Subjective:   [name] participated from home, via video, and consented to treatment. I discussed the limitations of evaluation and management by telemedicine and the availability of in person appointments. The patient expressed understanding and agreed to proceed.  Therapist participated from office.  Antoine reviewed the events of the past week.   Patient's wife [name] of 16 years has moved out of their marital house and into an apartment. Patient reported that his wife wanted patient to work on himself by learning how to communicate rather than shut down. Patient discussed his addiction and the negative impact it has had on patient, his wife, and his family. 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 It went from a baby step to being much bigger than that.  Interventions: Cognitive Behavioral Therapy  Diagnosis: Anxiety and depression [F41.9, F32.A]  (New diagnosis due to major change in patient's  life)  Psychiatric Treatment: No   Treatment Plan:  Client Abilities/Strengths [name] is bright and committed to doing what is best for his family.    Support System: His Bishop  Client Treatment Preferences OPT  Client Statement of Needs [name] would like to express himself more effectively and have a life outside of work.   Treatment Level Weekly  Symptoms  [name] experiences symptoms of anxiety and depression with regard to his addition.   Goals:   Target Date: 04/09/25 Frequency: Weekly  Progress: 35% Modality: individual    Therapist will provide referrals for additional resources as appropriate.  Therapist will provide psycho-education regarding Silver's diagnosis and corresponding treatment approaches and interventions. [name] will support the patient's ability to achieve the goals identified. will employ CBT, BA, Problem-solving, Solution Focused, Mindfulness,  coping skills, & other evidenced-based practices will be used to promote progress towards healthy functioning to help manage decrease symptoms associated with their diagnosis.   Reduce overall level, frequency, and intensity of the feelings of depression, anxiety and panic evidenced by decreased overall symptoms from 6 to 7 days/week to 0 to 1 days/week per client report for at least 3 consecutive months. Verbally express understanding of the relationship between feelings of depression and anxiety and their impact on thinking patterns and behaviors. Verbalize an understanding of the role that distorted thinking plays in creating fears, excessive worry, and ruminations.  (Argus participated in the creation of the treatment plan)    [name]

## 2024-09-10 ENCOUNTER — Ambulatory Visit: Admitting: Psychology

## 2024-09-10 DIAGNOSIS — F419 Anxiety disorder, unspecified: Secondary | ICD-10-CM | POA: Diagnosis not present

## 2024-09-10 DIAGNOSIS — F32A Depression, unspecified: Secondary | ICD-10-CM

## 2024-09-10 NOTE — Progress Notes (Signed)
 [facility] Counselor/Therapist Progress Note  Patient ID: Justin [name], MRN: [id number]    Date: 09/10/24  Time Spent: 5:01 pm - 5:54 pm : 53 minutes    Treatment Type: Individual Therapy.  Reported Symptoms: Patient has his kids tonight and tomorrow, and this has been two weeks since his wife moved out of their home.  Mental Status Exam: Appearance:  NA     Behavior: Appropriate  Motor: Restlestness  Speech/Language:  Normal Rate  Affect: Depressed  Mood: anxious  Thought process: normal  Thought content:   WNL  Sensory/Perceptual disturbances:   WNL  Orientation: oriented to person, place, and time/date  Attention: Good  Concentration: Good  Memory: WNL  Fund of knowledge:  Good  Insight:   Good  Judgment:  Good  Impulse Control: Good   Risk Assessment: Danger to Self:  No Self-injurious Behavior: No Danger to Others: No Duty to Warn:no Physical Aggression / Violence:No  Access to Firearms a concern: No  Gang Involvement:No   Subjective:   Justin [name] participated from home, via video, and consented to treatment. I discussed the limitations of evaluation and management by telemedicine and the availability of in person appointments. The patient expressed understanding and agreed to proceed.  Therapist participated from home office.  Jerl reviewed the events of the past week.   Patient and his wife, Justin [name], have talked a couple of times this week about each other's feelings. Patient  continues to strugglle with his addiction, and he was receptive to reslurces specific to his addiction.   Interventions: Cognitive Behavioral Therapy  Diagnosis:  Anxiety and depression [F41.9, F32.A]  (New diagnosis due to major change in patient's life)   Psychiatric Treatment: No   Treatment Plan:  Client Abilities/Strengths Justin [name] is bright and committed to doing what is best for his family.  Support System: His Bishop  Client Treatment  Preferences OPT  Client Statement of Needs Justin [name] would like to express himself more effectively and have a life outside of work.   Treatment Level Weekly  Symptoms  Justin [name] experiences symptoms of anxiety and depression with regard to his addiction.   Goals:   Target Date: 04/09/25 Frequency: Weekly  Progress: 40% Modality: individual    Therapist will provide referrals for additional resources as appropriate.  Therapist will provide psycho-education regarding Justin [name]'s diagnosis and corresponding treatment approaches and interventions. Justin [name] [name] will support the patient's ability to achieve the goals identified. will employ CBT, BA, Problem-solving, Solution Focused, Mindfulness,  coping skills, & other evidenced-based practices will be used to promote progress towards healthy functioning to help manage decrease symptoms associated with their diagnosis.   Reduce overall level, frequency, and intensity of the feelings of depression, anxiety and panic evidenced by decreased overall symptoms from 6 to 7 days/week to 0 to 1 days/week per client report for at least 3 consecutive months. Verbally express understanding of the relationship between feelings of depression and anxiety and their impact on thinking patterns and behaviors. Verbalize an understanding of the role that distorted thinking plays in creating fears, excessive worry, and ruminations.  (Justin [name] participated in the creation of the treatment plan)   Justin [name] [name]

## 2024-09-15 ENCOUNTER — Other Ambulatory Visit (HOSPITAL_BASED_OUTPATIENT_CLINIC_OR_DEPARTMENT_OTHER): Payer: Self-pay

## 2024-09-15 MED ORDER — LISDEXAMFETAMINE DIMESYLATE 30 MG PO CAPS
30.0000 mg | ORAL_CAPSULE | Freq: Every day | ORAL | 0 refills | Status: DC
  Filled 2024-09-15: qty 30, 30d supply, fill #0

## 2024-09-15 NOTE — Progress Notes (Unsigned)
 SABRA

## 2024-09-16 ENCOUNTER — Encounter: Payer: Self-pay | Admitting: Adult Health

## 2024-09-16 ENCOUNTER — Telehealth (INDEPENDENT_AMBULATORY_CARE_PROVIDER_SITE_OTHER): Admitting: Adult Health

## 2024-09-16 DIAGNOSIS — G4733 Obstructive sleep apnea (adult) (pediatric): Secondary | ICD-10-CM

## 2024-09-17 ENCOUNTER — Ambulatory Visit: Admitting: Psychology

## 2024-09-17 DIAGNOSIS — F419 Anxiety disorder, unspecified: Secondary | ICD-10-CM | POA: Diagnosis not present

## 2024-09-17 DIAGNOSIS — F32A Depression, unspecified: Secondary | ICD-10-CM

## 2024-09-17 NOTE — Progress Notes (Signed)
 [facility] Counselor/Therapist Progress Note  Patient ID: Justin [name], MRN: [id number]    Date: 09/17/24  Time Spent: 5:00 pm - 5:53 pm : 53 minutes   Treatment Type: Individual Therapy.  Reported Symptoms: Patient was confused by what seemed like a change of heart for his wife  Mental Status Exam: Appearance:  NA     Behavior: Appropriate  Motor: Normal  Speech/Language:  Clear and Coherent  Affect: Depressed  Mood: anxious  Thought process: normal  Thought content:   WNL  Sensory/Perceptual disturbances:   WNL  Orientation: oriented to person, place, and time/date  Attention: Good  Concentration: Good  Memory: WNL  Fund of knowledge:  Good  Insight:   Good  Judgment:  Good  Impulse Control: Good   Risk Assessment: Danger to Self:  No Self-injurious Behavior: No Danger to Others: No Duty to Warn:no Physical Aggression / Violence:No  Access to Firearms a concern: No  Gang Involvement:No   Subjective:   Justin [name] participated from home, via video, and consented to treatment. I discussed the limitations of evaluation and management by telemedicine and the availability of in person appointments. The patient expressed understanding and agreed to proceed.  Therapist participated from home office.  Lydia reviewed the events of the past week.   Patient reported that he saw his wife today and his wife texted an inappropriate comment to him. Patient was not shocked by his wife's text, and he explained his lack of shock as she's done it before. Patient felt anxious about his wife's behavior, and he vacillates between being depressed and hopeless   Interventions: Cognitive Behavioral Therapy  Diagnosis: Anxiety and depression F41.9, F32 A  Psychiatric Treatment: No   Treatment Plan:  Client Abilities/Strengths Justin [name] is bright and committed to doing what is best for his family.   Support System: His Bishop  Client Treatment  Preferences OPT  Client Statement of Needs Justin [name] would like to express himself more effectively and have a life outside of work   Treatment Level Weekly  Symptoms  Justin [name] experiences symptoms of anxiety and depression with regard to his addiction.   Goals:   Target Date: 5.23.26 Frequency: Weekly  Progress: 45% Modality: individual    Therapist will provide referrals for additional resources as appropriate.  Therapist will provide psycho-education regarding Justin [name]'s diagnosis and corresponding treatment approaches and interventions. [name] will support the patient's ability to achieve the goals identified. will employ CBT, BA, Problem-solving, Solution Focused, Mindfulness,  coping skills, & other evidenced-based practices will be used to promote progress towards healthy functioning to help manage decrease symptoms associated with their diagnosis.   Reduce overall level, frequency, and intensity of the feelings of depression, anxiety and panic evidenced by decreased overall symptoms from 6 to 7 days/week to 0 to 1 days/week per client report for at least 3 consecutive months. Verbally express understanding of the relationship between feelings of depression and anxiety and their impact on thinking patterns and behaviors. Verbalize an understanding of the role that distorted thinking plays in creating fears, excessive worry, and ruminations.  (Justin [name] participated in the creation of the treatment plan)    [name]

## 2024-09-24 ENCOUNTER — Ambulatory Visit: Admitting: Psychology

## 2024-09-24 DIAGNOSIS — F32A Depression, unspecified: Secondary | ICD-10-CM | POA: Diagnosis not present

## 2024-09-24 DIAGNOSIS — F419 Anxiety disorder, unspecified: Secondary | ICD-10-CM | POA: Diagnosis not present

## 2024-09-24 NOTE — Progress Notes (Signed)
 [facility] Counselor/Therapist Progress Note  Patient ID: Katsumi Wisler, MRN: [id number]    Date: 09/24/24  Time Spent: 5:00 pm - 5:53 pm : 53 minutes  Treatment Type: Individual Therapy.  Reported Symptoms: It feels like I am alone in dealing with my wife and daughters.   Mental Status Exam: Appearance:  NA     Behavior: Appropriate  Motor: Normal  Speech/Language:  Pressured  Affect: Depressed  Mood: anxious  Thought process: normal  Thought content:   WNL  Sensory/Perceptual disturbances:   WNL  Orientation: oriented to person, place, and time/date  Attention: Good  Concentration: Good  Memory: WNL  Fund of knowledge:  Good  Insight:   Good  Judgment:  Good  Impulse Control: Good   Risk Assessment: Danger to Self:  No Self-injurious Behavior: No Danger to Others: No Duty to Warn:no Physical Aggression / Violence:No  Access to Firearms a concern: No  Gang Involvement:No   Subjective:   [name] participated from home, via video, and consented to treatment. I discussed the limitations of evaluation and management by telemedicine and the availability of in person appointments. The patient expressed understanding and agreed to proceed.  Therapist participated from home office.  Nirvaan reviewed the events of the past week.   Patient reached out to one of the addiction programs costs $5000 and patient cannot afford that much money. Patient is struggling in his relationship with his wife who moved out of their home with their daughters.       Interventions: Cognitive Behavioral Therapy  Diagnosis: Anxiety and depression F41.9, F32 A  Psychiatric Treatment: No  Treatment Plan:  Client Abilities/Strengths Francois is bright and committed to doing what is best for his family.  Support System: His Bishop  Client Treatment Preferences OPT  Client Statement of Needs Abas would like to express himself more effecively and have a  life outside of work.    Treatment Level Weekly  Symptoms  [name] experiences symptoms of anxiety and depression with regard to his addiction.  (Status: maintained)  Goals:   Target Date: 5.23.26 Frequency: Weekly  Progress: 45% Modality: individual    Therapist will provide referrals for additional resources as appropriate.  Therapist will provide psycho-education regarding Fain's diagnosis and corresponding treatment approaches and interventions. [name] will support the patient's ability to achieve the goals identified. will employ CBT, BA, Problem-solving, Solution Focused, Mindfulness,  coping skills, & other evidenced-based practices will be used to promote progress towards healthy functioning to help manage decrease symptoms associated with their diagnosis.   Reduce overall level, frequency, and intensity of the feelings of depression, anxiety and panic evidenced by decreased overall symptoms from 6 to 7 days/week to 0 to 1 days/week per client report for at least 3 consecutive months. Verbally express understanding of the relationship between feelings of depression and anxiety and their impact on thinking patterns and behaviors. Verbalize an understanding of the role that distorted thinking plays in creating fears, excessive worry, and ruminations.  (Breanna participated in the creation of the treatment plan)    [name]

## 2024-10-01 ENCOUNTER — Ambulatory Visit: Admitting: Psychology

## 2024-10-01 DIAGNOSIS — F32A Depression, unspecified: Secondary | ICD-10-CM

## 2024-10-01 DIAGNOSIS — F419 Anxiety disorder, unspecified: Secondary | ICD-10-CM | POA: Diagnosis not present

## 2024-10-01 NOTE — Progress Notes (Signed)
 [facility] Counselor/Therapist Progress Note  Patient ID: Justin [name], MRN: [id number]    Date: 10/01/24  Time Spent: 5:33 pm - 5:55 pm : 20 minutes (Patient had a schedule conflict from driving his daughter, so he requested that we shorten today's session.)  Treatment Type: Individual Therapy.  Reported Symptoms: Patient reported that he has not followed up yet with the addiction resources that he had agreed to follow up with the past week  Mental Status Exam: Appearance:  NA     Behavior: Appropriate  Motor: Normal  Speech/Language:  Clear and Coherent  Affect: Depressed  Mood: anxious  Thought process: normal  Thought content:   WNL  Sensory/Perceptual disturbances:   WNL  Orientation: oriented to person, place, and time/date  Attention: Good  Concentration: Good  Memory: WNL  Fund of knowledge:  Good  Insight:   Good  Judgment:  Good  Impulse Control: Good   Risk Assessment: Danger to Self:  No Self-injurious Behavior: No Danger to Others: No Duty to Warn:no Physical Aggression / Violence:No  Access to Firearms a concern: No  Gang Involvement:No   Subjective:   Justin [name] participated from home, via video, and consented to treatment. I discussed the limitations of evaluation and management by telemedicine and the availability of in person appointments. The patient expressed understanding and agreed to proceed.  Therapist participated from home office.  Fulton reviewed the events of the past week.   Patient reported that he and his wife (from whom he is separated) have continued to argue, and have been struggling with their communication. Patient was worried that he and his wife would not reconcile for a while.   Interventions: Cognitive Behavioral Therapy  Diagnosis:  Anxiety and depression F41.9, F32 A  Psychiatric Treatment: No   Treatment Plan:  Client Abilities/Strengths Justin [name] is bright and committed to doing what is best  for his family.   Support System: His Bishop  Client Treatment Preferences OPT  Client Statement of Needs Justin [name] would like to express himself more effectively and have a life outside of work.   Treatment Level Weekly  Symptoms  Justin [name] experiences symptoms of anxiety and depression with regard to the losses in his life (Status; maintained) Goals:   Target Date: 5.23.26 Frequency: Weekly  Progress: 45% Modality: individual    Therapist will provide referrals for additional resources as appropriate.  Therapist will provide psycho-education regarding Justin [name]'s diagnosis and corresponding treatment approaches and interventions. Justin [name] will support the patient's ability to achieve the goals identified. will employ CBT, BA, Problem-solving, Solution Focused, Mindfulness,  coping skills, & other evidenced-based practices will be used to promote progress towards healthy functioning to help manage decrease symptoms associated with their diagnosis.   Reduce overall level, frequency, and intensity of the feelings of depression, anxiety and panic evidenced by decreased overall symptoms from 6 to 7 days/week to 0 to 1 days/week per client report for at least 3 consecutive months. Verbally express understanding of the relationship between feelings of depression and anxiety and their impact on thinking patterns and behaviors. Verbalize an understanding of the role that distorted thinking plays in creating fears, excessive worry, and ruminations.  (Justin [name] participated in the creation of the treatment plan)    Justin [name]

## 2024-10-08 ENCOUNTER — Ambulatory Visit (INDEPENDENT_AMBULATORY_CARE_PROVIDER_SITE_OTHER): Admitting: Psychology

## 2024-10-08 DIAGNOSIS — F419 Anxiety disorder, unspecified: Secondary | ICD-10-CM

## 2024-10-08 DIAGNOSIS — F32A Depression, unspecified: Secondary | ICD-10-CM

## 2024-10-08 NOTE — Progress Notes (Signed)
 [facility] Counselor/Therapist Progress Note  Patient ID: Justin [name], MRN: [id number]    Date: 10/08/24  Time Spent: 5:00 - 5:53 pm : 53 minutes     Treatment Type: Individual Therapy.  Reported Symptoms: Patient is dealing with the stress of selling the house that   Mental Status Exam: Appearance:  NA     Behavior: Appropriate  Motor: Normal  Speech/Language:  Pressured  Affect: Depressed  Mood: anxious  Thought process: normal  Thought content:   WNL  Sensory/Perceptual disturbances:   WNL  Orientation: oriented to person, place, and time/date  Attention: Good  Concentration: Good  Memory: WNL  Fund of knowledge:  Good  Insight:   Fair  Judgment:  Fair  Impulse Control: Fair   Risk Assessment: Danger to Self:  No Self-injurious Behavior: No Danger to Others: No Duty to Warn:no Physical Aggression / Violence:No  Access to Firearms a concern: No  Gang Involvement:No   Subjective:   Justin [name] participated from home, via video, and consented to treatment. I discussed the limitations of evaluation and management by telemedicine and the availability of in person appointments. The patient expressed understanding and agreed to proceed.  Therapist participated from home office.  Yaden reviewed the events of the past week.   Patient reported that he is extremely stressed and down about his separation from his wife, Justin [name], of 16 years.They are in the process of selling their house, and that is problematic as well. Patient noted that his father, mother, and sister, did not and do not, like each other. This animosity began even before the patient married his current wife.    Interventions: Cognitive Behavioral Therapy  Diagnosis: Anxiety and depression F41.9, F32 A  Psychiatric Treatment: No   Treatment Plan:  Client Abilities/Strengths Justin [name] is bright and committed to doing what is best for his family.  Support System: His  Bishop  Client Treatment Preferences OPT  Client Statement of Needs Justin [name] would like to express himself more effectively and have a life outside of work.    Treatment Level Weekly  Symptoms  Justin [name] experiences symptoms of anxiety and depression with regard to his addiction and his current separation (Status : maintained).   Goals:   Target Date: 5.23.26 Frequency: Weekly  Progress: 50% Modality: individual    Therapist will provide referrals for additional resources as appropriate.  Therapist will provide psycho-education regarding Justin [name] diagnosis and corresponding treatment approaches and interventions. [name] will support the patient's ability to achieve the goals identified. will employ CBT, BA, Problem-solving, Solution Focused, Mindfulness,  coping skills, & other evidenced-based practices will be used to promote progress towards healthy functioning to help manage decrease symptoms associated with their diagnosis.   Reduce overall level, frequency, and intensity of the feelings of depression, anxiety and panic evidenced by decreased overall symptoms from 6 to 7 days/week to 0 to 1 days/week per client report for at least 3 consecutive months. Verbally express understanding of the relationship between feelings of depression and anxiety and their impact on thinking patterns and behaviors. Verbalize an understanding of the role that distorted thinking plays in creating fears, excessive worry, and ruminations.  (Justin [name] participated in the creation of the treatment plan)    [name]

## 2024-10-13 ENCOUNTER — Other Ambulatory Visit (HOSPITAL_BASED_OUTPATIENT_CLINIC_OR_DEPARTMENT_OTHER): Payer: Self-pay

## 2024-10-13 MED ORDER — LISDEXAMFETAMINE DIMESYLATE 30 MG PO CAPS
30.0000 mg | ORAL_CAPSULE | Freq: Every day | ORAL | 0 refills | Status: DC
  Filled 2024-10-15: qty 30, 30d supply, fill #0

## 2024-10-15 ENCOUNTER — Other Ambulatory Visit: Payer: Self-pay

## 2024-10-15 ENCOUNTER — Other Ambulatory Visit (HOSPITAL_BASED_OUTPATIENT_CLINIC_OR_DEPARTMENT_OTHER): Payer: Self-pay

## 2024-10-22 ENCOUNTER — Ambulatory Visit: Admitting: Psychology

## 2024-10-22 DIAGNOSIS — F419 Anxiety disorder, unspecified: Secondary | ICD-10-CM

## 2024-10-22 NOTE — Progress Notes (Signed)
 [facility] Counselor/Therapist Progress Note  Patient ID: Justin [name], MRN: [id number]    Date: 10/22/24  Time Spent: 5:02 pm - 5:55 pm : 53 minutes     Treatment Type: Individual Therapy.  Reported Symptoms: I still have my emotional moments regarding being separated from my wife and daughters.   Mental Status Exam: Appearance:  NA     Behavior: Appropriate  Motor: Normal  Speech/Language:  Pressured  Affect: Depressed  Mood: anxious  Thought process: normal  Thought content:   WNL  Sensory/Perceptual disturbances:   WNL  Orientation: oriented to person, place, and time/date  Attention: Good  Concentration: Good  Memory: WNL  Fund of knowledge:  Good  Insight:   Fair  Judgment:  Fair  Impulse Control: Good   Risk Assessment: Danger to Self:  No Self-injurious Behavior: No Danger to Others: No Duty to Warn:no Physical Aggression / Violence:No  Access to Firearms a concern: No  Gang Involvement:No   Subjective:   Justin [name] participated from home, via video, and consented to treatment. I discussed the limitations of evaluation and management by telemedicine and the availability of in person appointments. The patient expressed understanding and agreed to proceed.  Therapist participated from home office.  Justin [name] reviewed the events of the past week.   Patient reported that yesterday was his birthday and he had some emotional moments yesterday and today. Patient related that his separated wife gave him a card and a cheesecake this year. Last year she waited until the the end of the day to acknowledge his birthday. In addition his kids were sick, and patient didn't feel like there was much to celebrate. Patient felt anxious about the house he owned with his wife Justin [name], being sold on Dec. 29, 2026. Patient also felt hopeless about reconciliation, but was committed to going forward and continuing to be a a great father and a good co-parent.  Patient learned that December 29 is scheduled for the closing/selling of their house. Patient reported being tearful yesterday and today.    Interventions: Cognitive Behavioral Therapy  Diagnosis:  Anxiety and depressions F41.9, F32A  Psychiatric Treatment: No   Treatment Plan:  Client Abilities/Strengths Justin [name] is bright and committed to doing what is best for his family.   Support System: His Bishop  Client Treatment Preferences OPT  Client Statement of Needs Justin [name] would like to express himself more effectively and have a life outside of work.    Treatment Level Weekly  Symptoms  Justin [name] experiences symptoms of anxiety and depression with regard to his addiction and his current separation (Status : maintained)  Goals:   Target Date: 5.23.26 Frequency: Weekly  Progress: 55% Modality: individual    Therapist will provide referrals for additional resources as appropriate.  Therapist will provide psycho-education regarding Justin [name]'s diagnosis and corresponding treatment approaches and interventions. Justin [name] will support the patient's ability to achieve the goals identified. will employ CBT, BA, Problem-solving, Solution Focused, Mindfulness,  coping skills, & other evidenced-based practices will be used to promote progress towards healthy functioning to help manage decrease symptoms associated with their diagnosis.   Reduce overall level, frequency, and intensity of the feelings of depression, anxiety and panic evidenced by decreased overall symptoms from 6 to 7 days/week to 0 to 1 days/week per client report for at least 3 consecutive months. Verbally express understanding of the relationship between feelings of depression and anxiety and their impact on thinking patterns and behaviors. Verbalize an understanding of the role that  distorted thinking plays in creating fears, excessive worry, and ruminations.  (Justin [name] participated in the creation of the  treatment plan)    Justin [name]

## 2024-10-29 ENCOUNTER — Ambulatory Visit: Admitting: Psychology

## 2024-10-29 DIAGNOSIS — F419 Anxiety disorder, unspecified: Secondary | ICD-10-CM

## 2024-10-29 DIAGNOSIS — F32A Depression, unspecified: Secondary | ICD-10-CM | POA: Diagnosis not present

## 2024-10-29 NOTE — Progress Notes (Unsigned)
 [facility] Counselor/Therapist Progress Note  Patient ID: Justin [name], MRN: [id number]    Date: 10/29/2024  Time Spent: 5:00 pm - 5:53 pm : 53 minutes    Treatment Type: Individual Therapy.  Reported Symptoms: I still have my emotional moments, and I learned a lot from spending time with my family.   Mental Status Exam: Appearance:  NA     Behavior: Appropriate  Motor: Normal  Speech/Language:  Pressured  Affect: Depressed  Mood: anxious  Thought process: normal  Thought content:   WNL  Sensory/Perceptual disturbances:   WNL  Orientation: oriented to person, place, and time/date  Attention: Good  Concentration: Good  Memory: WNL  Fund of knowledge:  Good  Insight:   Good  Judgment:  Good  Impulse Control: Good   Risk Assessment: Danger to Self:  No Self-injurious Behavior: No Danger to Others: No Duty to Warn:no Physical Aggression / Violence:No  Access to Firearms a concern: No  Gang Involvement:No   Subjective:   Justin [name] participated from home, via video, and consented to treatment. I discussed the limitations of evaluation and management by telemedicine and the availability of in person appointments. The patient expressed understanding and agreed to proceed.  Therapist participated from home office.  Justin [name] reviewed the events of the past week.   Patient will be moving out of their house and into an apartment all alone on December 18, which is 6 days away. Patient has had to move into apartments before and it feels like this time is similar, yet different. Patient continues to worry about his future with his wife and their daughters, and he has moments of hopelessness as a part of this transition.   Interventions: Cognitive Behavioral Therapy  Diagnosis: Anxiety and depression F41, F32  Psychiatric Treatment: No   Treatment Plan:  Client Abilities/Strengths Justin [name] is bright and committed to doing what is best for his  family  Support System: His Bishop  Client Treatment Preferences OPT  Client Statement of Needs Justin [name] would like to express himself more effectively and have a life outside of work.   Treatment Level Weekly  Symptoms  Justin [name] experiences symptoms of anxiety and depression with regard to his addiction and his current separation. (Status : maintained)  Goals:   Target Date: 5.23.26 Frequency: Weekly  Progress: 55% Modality: individual    Therapist will provide referrals for additional resources as appropriate.  Therapist will provide psycho-education regarding Justin [name]'s diagnosis and corresponding treatment approaches and interventions. Justin [name] will support the patient's ability to achieve the goals identified. will employ CBT, BA, Problem-solving, Solution Focused, Mindfulness,  coping skills, & other evidenced-based practices will be used to promote progress towards healthy functioning to help manage decrease symptoms associated with their diagnosis.   Reduce overall level, frequency, and intensity of the feelings of depression, anxiety and panic evidenced by decreased overall symptoms from 6 to 7 days/week to 0 to 1 days/week per client report for at least 3 consecutive months. Verbally express understanding of the relationship between feelings of depression and anxiety and their impact on thinking patterns and behaviors. Verbalize an understanding of the role that distorted thinking plays in creating fears, excessive worry, and ruminations.  (Justin [name] participated in the creation of the treatment plan)   Justin [name]

## 2024-11-05 ENCOUNTER — Ambulatory Visit: Admitting: Psychology

## 2024-11-16 ENCOUNTER — Ambulatory Visit: Admitting: Psychology

## 2024-11-16 ENCOUNTER — Other Ambulatory Visit (HOSPITAL_BASED_OUTPATIENT_CLINIC_OR_DEPARTMENT_OTHER): Payer: Self-pay

## 2024-11-16 DIAGNOSIS — F32A Depression, unspecified: Secondary | ICD-10-CM | POA: Diagnosis not present

## 2024-11-16 DIAGNOSIS — F419 Anxiety disorder, unspecified: Secondary | ICD-10-CM | POA: Diagnosis not present

## 2024-11-16 NOTE — Progress Notes (Signed)
 [facility] Counselor/Therapist Progress Note  Patient ID: Justin [name], MRN: [id number]    Date: 11/16/2024  Time Spent: 3:00 pm - 3:53 pm : 53 minutes    Treatment Type: Individual Therapy.  Reported Symptoms: It's not like any other separation that my wife and I have had. It's good as a re-set for us -not having access to each other. It's bad for us  not having access to each other, because it hurts   Mental Status Exam: Appearance:  NA     Behavior: Appropriate  Motor: Normal  Speech/Language:  Slow  Affect: Depressed  Mood: anxious  Thought process: normal  Thought content:   WNL  Sensory/Perceptual disturbances:   WNL  Orientation: oriented to person, place, and time/date  Attention: Good  Concentration: Good  Memory: WNL  Fund of knowledge:  Good  Insight:   Fair  Judgment:  Fair  Impulse Control: Good   Risk Assessment: Danger to Self:  No Self-injurious Behavior: No Danger to Others: No Duty to Warn:no Physical Aggression / Violence:No  Access to Firearms a concern: No  Gang Involvement:No   Subjective:   Justin [name] participated from home, via video, and consented to treatment. I discussed the limitations of evaluation and management by telemedicine and the availability of in person appointments. The patient expressed understanding and agreed to proceed.  Therapist participated from home office.  Justin [name] the events of the past week.   Patient wants to move forward towards his wife, as a part of this separation. Patient thinks his wife wants to move forward, he's not sure if she wants to move forward toward him or not. Patient is 100% certain that he wants to reconcile with his wife [name]. Patient doesn't think his wife [name] is 100% certain she wants to reconcile with the patient. Patient and wife [name] have been together for 16/17 years. Patient reported feeling down because he spent Christmas without his wife, and he felt  nervous because he still wasn't sure if his wife wants to reconcile.   Interventions: Cognitive Behavioral Therapy  Diagnosis: Anxiety and depression F41, F32  Psychiatric Treatment: No   Treatment Plan:  Client Abilities/Strengths Justin [name] is bright and committed to doing what is best for his family.  Support System: His Bishop  Client Treatment Preferences OPT  Client Statement of Needs Justin [name] would like to express himself more effectively and have a life outside of work.    Treatment Level Weekly  Symptoms Justin [name] experiences symptoms of anxiety and depression.   Goals:  Target Date: 5.23.26 Frequency: Weekly  Progress: 55% Modality: individual    Therapist will provide referrals for additional resources as appropriate.  Therapist will provide psycho-education regarding Justin [name]'s diagnosis and corresponding treatment approaches and interventions. Justin [name] will support the patient's ability to achieve the goals identified. will employ CBT, BA, Problem-solving, Solution Focused, Mindfulness,  coping skills, & other evidenced-based practices will be used to promote progress towards healthy functioning to help manage decrease symptoms associated with their diagnosis.   Reduce overall level, frequency, and intensity of the feelings of depression, anxiety and panic evidenced by decreased overall symptoms from 6 to 7 days/week to 0 to 1 days/week per client report for at least 3 consecutive months. Verbally express understanding of the relationship between feelings of depression and anxiety and their impact on thinking patterns and behaviors. Verbalize an understanding of the role that distorted thinking plays in creating fears, excessive worry, and ruminations.  (Justin [name] participated in the creation of  the treatment plan)    Justin [name]

## 2024-11-19 ENCOUNTER — Ambulatory Visit: Admitting: Psychology

## 2024-11-19 ENCOUNTER — Other Ambulatory Visit (HOSPITAL_BASED_OUTPATIENT_CLINIC_OR_DEPARTMENT_OTHER): Payer: Self-pay

## 2024-11-19 MED ORDER — LISDEXAMFETAMINE DIMESYLATE 30 MG PO CAPS
30.0000 mg | ORAL_CAPSULE | Freq: Every day | ORAL | 0 refills | Status: AC
  Filled 2024-11-19: qty 30, 30d supply, fill #0

## 2024-11-26 ENCOUNTER — Ambulatory Visit: Admitting: Psychology

## 2024-11-26 DIAGNOSIS — F32A Depression, unspecified: Secondary | ICD-10-CM

## 2024-11-26 NOTE — Progress Notes (Unsigned)
 [facility] Counselor/Therapist Progress Note  Patient ID: Osborne Serio, MRN: [id number]    Date: 11/26/2024  Time Spent: 5:00 pm - 5:53 pm : 54 minutes    Treatment Type: Individual Therapy.  Reported Symptoms: My work schedule is changing which will be problematic, because it will greatly limit the time I can spend with my daughters.   Mental Status Exam: Appearance:  NA     Behavior: Appropriate  Motor: Normal  Speech/Language:  Slow  Affect: Depressed  Mood: anxious  Thought process: normal  Thought content:   WNL  Sensory/Perceptual disturbances:   WNL  Orientation: oriented to person, place, and time/date  Attention: Good  Concentration: Good  Memory: WNL  Fund of knowledge:  Good  Insight:   Good  Judgment:  Good  Impulse Control: Good   Risk Assessment: Danger to Self:  No Self-injurious Behavior: No Danger to Others: No Duty to Warn:no Physical Aggression / Violence:No  Access to Firearms a concern: No  Gang Involvement:No   Subjective:   [name] participated from home, via video, and consented to treatment. I discussed the limitations of evaluation and management by telemedicine and the availability of in person appointments. The patient expressed understanding and agreed to proceed.  Therapist participated from home office.  Prestin reviewed the events of the past week.   Patient expressed frustration and couldn't stop worrying about the closing of their house, as part of the plan for the separation/divorce since his wife moved out of their family home. Patient also reported feeling down and having less energy regarding his marital situation, and dealing with the lender's continued delay. Patient reported that his wife repeatedly states that I've told you many times before, but you never listened or changed.  Interventions: Cognitive Behavioral Therapy  Diagnosis: Anxiety and depression F41, F32  Psychiatric Treatment: No    Treatment Plan:  Client Abilities/Strengths My would like to express himself more effectively and have a life outside of work.  Support System: His Bishop  Client Treatment Preferences OPT  Client Statement of Needs Courtenay would like to express himself more effectively and have a life outside of work.    Treatment Level Weekly  Symptoms  [name] experiences symptoms of anxiety and depression.   Goals:  Target Date: 5.23.26 Frequency: Weekly  Progress: 55% Modality: individual    Therapist will provide referrals for additional resources as appropriate.  Therapist will provide psycho-education regarding Zyiere's diagnosis and corresponding treatment approaches and interventions. [name] will support the patient's ability to achieve the goals identified. will employ CBT, BA, Problem-solving, Solution Focused, Mindfulness,  coping skills, & other evidenced-based practices will be used to promote progress towards healthy functioning to help manage decrease symptoms associated with their diagnosis.   Reduce overall level, frequency, and intensity of the feelings of depression, anxiety and panic evidenced by decreased overall symptoms from 6 to 7 days/week to 0 to 1 days/week per client report for at least 3 consecutive months. Verbally express understanding of the relationship between feelings of depression and anxiety and their impact on thinking patterns and behaviors. Verbalize an understanding of the role that distorted thinking plays in creating fears, excessive worry, and ruminations.  (Laronn participated in the creation of the treatment plan)    [name]

## 2024-12-03 ENCOUNTER — Ambulatory Visit: Admitting: Psychology

## 2024-12-03 DIAGNOSIS — Z630 Problems in relationship with spouse or partner: Secondary | ICD-10-CM

## 2024-12-03 NOTE — Progress Notes (Signed)
 [facility] Counselor/Therapist Progress Note  Patient ID: Justin [name], MRN: [id number]    Date: 12/03/24  Time Spent: 5:00 pm - 5:53 pm : 53 minutes    Treatment Type: Individual Therapy.  Reported Symptoms: Patient reported still reeling from his wife starting to talk about moving on from their marriage.   Mental Status Exam: Appearance:  NA     Behavior: Appropriate  Motor: Normal  Speech/Language:  Clear and Coherent  Affect: Depressed  Mood: anxious  Thought process: normal  Thought content:   WNL  Sensory/Perceptual disturbances:   WNL  Orientation: oriented to person, place, and time/date  Attention: Good  Concentration: Good  Memory: WNL  Fund of knowledge:  Good  Insight:   Good  Judgment:  Good  Impulse Control: Good   Risk Assessment: Danger to Self:  No Self-injurious Behavior: No Danger to Others: No Duty to Warn:no Physical Aggression / Violence:No  Access to Firearms a concern: No  Gang Involvement:No   Subjective:   Justin [name] participated from home, via video, and consented to treatment. I discussed the limitations of evaluation and management by telemedicine and the availability of in person appointments. The patient expressed understanding and agreed to proceed.  Therapist participated from home office.  Justin [name] reviewed the events of the past week.   Patient reported feeling down and sad at the thought of his wife moving on from their marriage. He described feeling heartbroken, but he was also committed to being the best co-parent ever. He also admitted to feeling nervous and on edge about the house still not being on the market, and all of the changes that would be taking place in the future. Patient's job was going a little better because changes wouldn't be made there until next month.  Interventions: Cognitive Behavioral Therapy  Diagnosis: Anxiety and Depression F41, F32  Psychiatric Treatment: No   Treatment  Plan:  Client Abilities/Strengths Justin [name] would like to express himself more effectively and have a life outside of work.  Support System: His Bishop  Client Treatment Preferences OPT  Client Statement of Needs Justin [name] would like to express himself more effectively and have a life outside of work.    Treatment Level Weekly  Symptoms  Justin [name] experiences symptoms of anxiety and depression.   Goals:  Target Date: 5.23.26 Frequency: Weekly  Progress: 60% Modality: individual    Therapist will provide referrals for additional resources as appropriate.  Therapist will provide psycho-education regarding Justin [name]'s diagnosis and corresponding treatment approaches and interventions. Justin [name] Justin [name] will support the patient's ability to achieve the goals identified. will employ CBT, BA, Problem-solving, Solution Focused, Mindfulness,  coping skills, & other evidenced-based practices will be used to promote progress towards healthy functioning to help manage decrease symptoms associated with their diagnosis.   Reduce overall level, frequency, and intensity of the feelings of depression, anxiety and panic evidenced by decreased overall symptoms from 6 to 7 days/week to 0 to 1 days/week per client report for at least 3 consecutive months. Verbally express understanding of the relationship between feelings of depression and anxiety and their impact on thinking patterns and behaviors. Verbalize an understanding of the role that distorted thinking plays in creating fears, excessive worry, and ruminations.  (Justin [name] participated in the creation of the treatment plan)     Justin [name] Justin [name]

## 2024-12-10 ENCOUNTER — Ambulatory Visit: Admitting: Psychology

## 2024-12-10 DIAGNOSIS — F32A Depression, unspecified: Secondary | ICD-10-CM

## 2024-12-10 NOTE — Progress Notes (Signed)
 [facility] Counselor/Therapist Progress Note  Patient ID: Justin [name], MRN: [id number]    Date: 12/10/24  Time Spent: 5:00 pm - 5:17pm Pt had an emergency so we had to end very early.    Treatment Type: Individual Therapy.  Reported Symptoms: Patient was still trying to deal with his wife telling him that their marriage was over and they would just be co-parents.   Mental Status Exam: Appearance:  NA     Behavior: Sharing  Motor: Restlestness  Speech/Language:  Clear and Coherent  Affect: Depressed  Mood: anxious  Thought process: normal  Thought content:   WNL  Sensory/Perceptual disturbances:   WNL  Orientation: oriented to person, place, and time/date  Attention: Good  Concentration: Good  Memory: WNL  Fund of knowledge:  Good  Insight:   Good  Judgment:  Good  Impulse Control: Good   Risk Assessment: Danger to Self:  No Self-injurious Behavior: No Danger to Others: No Duty to Warn:no Physical Aggression / Violence:No  Access to Firearms a concern: No  Gang Involvement:No   Subjective:   Justin [name] participated from home, via video, and consented to treatment. I discussed the limitations of evaluation and management by telemedicine and the availability of in person appointments. The patient expressed understanding and agreed to proceed.  Therapist participated from home office.  Justin [name] reviewed the events of the past week.   Pt was worried, anxious, depressed, and had little energy as he came to the realization that he needs to make finding peace for himself a priority to be able to grow by prioritizing the following: Faith 1a. His Bishop 2.  Finding myself again 2a. Understanding myself, who I really am, what I stand for, who I want to be  3. Rekindling and maintaining a relationship with my kids 3a. Spending quality time with them,  3b. Doing things that they enjoy  Interventions: Cognitive Behavioral Therapy  Diagnosis: Anxiety  and Depression F41, F32  Psychiatric Treatment: No   Treatment Plan:  Client Abilities/Strengths Justin [name] would like to express himself more effectively and have a life outside of work.  Support System: His Bishop and his church  Client Treatment Preferences OPT  Client Statement of Needs Justin [name] would like to Pt has come to the realization that he needs to make finding peace for himself a priority to be able to grow.   Faith 1a. His Bishop 2.  Finding myself again 2a. Understanding myself, who I really am, what I stand for, who I want to be  3. Rekindling and maintaining a relationship with my kids 3a. Spending quality time with them,  3b. Doing things that they enjoy   Treatment Level Weekly  Symptoms  Justin [name] experiences symptoms of anxiety and depression.(Status : maintained).   Goals:   Target Date: 5.23.26 Frequency: Weekly  Progress: 65% Modality: individual    Therapist will provide referrals for additional resources as appropriate.  Therapist will provide psycho-education regarding Justin [name]'s diagnosis and corresponding treatment approaches and interventions. Justin [name] will support the patient's ability to achieve the goals identified. will employ CBT, BA, Problem-solving, Solution Focused, Mindfulness,  coping skills, & other evidenced-based practices will be used to promote progress towards healthy functioning to help manage decrease symptoms associated with their diagnosis.   Reduce overall level, frequency, and intensity of the feelings of depression, anxiety and panic evidenced by decreased overall symptoms from 6 to 7 days/week to 0 to 1 days/week per client report for at least 3 consecutive months.  Verbally express understanding of the relationship between feelings of depression and anxiety and their impact on thinking patterns and behaviors. Verbalize an understanding of the role that distorted thinking plays in creating fears, excessive worry,  and ruminations.  (Justin [name] participated in the creation of the treatment plan)   Justin [name]

## 2024-12-17 ENCOUNTER — Ambulatory Visit: Admitting: Psychology

## 2024-12-22 ENCOUNTER — Other Ambulatory Visit (HOSPITAL_BASED_OUTPATIENT_CLINIC_OR_DEPARTMENT_OTHER): Payer: Self-pay

## 2024-12-24 ENCOUNTER — Ambulatory Visit: Admitting: Psychology

## 2024-12-24 DIAGNOSIS — F32A Depression, unspecified: Secondary | ICD-10-CM

## 2024-12-24 NOTE — Progress Notes (Signed)
 [facility] Counselor/Therapist Progress Note  Patient ID: Justin [name], MRN: [id number]    Date: 12/24/24  Time Spent: 5:04 pm - 5:58 pm : 54 Minutes  Treatment Type: Individual Therapy.  Reported Symptoms: Pt has come to the realization that he needs to make finding peace for myself a priority to be able to grow.  Mental Status Exam: Appearance:  NA     Behavior: Sharing  Motor: Restlestness  Speech/Language:  Clear and Coherent  Affect: Depressed  Mood: anxious  Thought process: normal  Thought content:   WNL  Sensory/Perceptual disturbances:   WNL  Orientation: oriented to person, place, and time/date  Attention: Good  Concentration: Good  Memory: WNL  Fund of knowledge:  Good  Insight:   Good  Judgment:  Good  Impulse Control: Good   Risk Assessment: Danger to Self:  No Self-injurious Behavior: No Danger to Others: No Duty to Warn:no Physical Aggression / Violence:No  Access to Firearms a concern: No  Gang Involvement:No   Subjective:   Justin [name] participated from home, via video, and consented to treatment. I discussed the limitations of evaluation and management by telemedicine and the availability of in person appointments. The patient expressed understanding and agreed to proceed.  Therapist participated from home office.  Justin [name] reviewed the events of the past week.   Patient had a 6 hour phone conversation with his wife Justin [name] last week as they were both driving to different destinations. When asked what they talked about, patient was vague, but he wished that the call would never end.   One of his daughters is going to homecoming dance, so patient and that daughter will be looking for her homecoming dress. Patient discussed what it was like for him seeing his daughter all grown up. Wife is fine with it, because she knows that there will be a prom or several proms. One of his other daughters went to four proms!  Interventions:  Cognitive Behavioral Therapy  Diagnosis: Anxiety and Depression F41, F32  Psychiatric Treatment: No   Treatment Plan:  Client Abilities/Strengths Justin [name] would like to express himself more effectively and have a life outside of work.   Support System: His Bishop and his church  Client Treatment Preferences OPT  Client Statement of Needs Justin [name] would like to :   Increase his Faith 1a. His Bishop 2.  Finding myself again 2a. Understanding myself, who I really am, what I stand for, who I want to be  3. Rekindling and maintaining a relationship with my kids 3a. Spending quality time with his kids 3b. Doing things that his kids enjoy  Treatment Level Weekly  Symptoms  Justin [name] experiences symptoms of anxiety and depression (Status : maintained)  Goals:  Target Date: 04/09/25  Frequency: Weekly  Progress: 65% Modality: individual    Therapist will provide referrals for additional resources as appropriate.  Therapist will provide psycho-education regarding Justin [name] diagnosis and corresponding treatment approaches and interventions. Justin [name] will support the patient's ability to achieve the goals identified. will employ CBT, BA, Problem-solving, Solution Focused, Mindfulness,  coping skills, & other evidenced-based practices will be used to promote progress towards healthy functioning to help manage decrease symptoms associated with their diagnosis.   Reduce overall level, frequency, and intensity of the feelings of depression, anxiety and panic evidenced by decreased symptoms from 6 to 7 days/week to 0 to 1 days/week per client report for at least 3 consecutive months. Verbally express understanding of the relationship between feelings of depression  and anxiety and their impact on thinking patterns and behaviors. Verbalize an understanding of the role that distorted thinking plays in creating fears, excessive worry, and ruminations.  (Bijan participated in the  creation of the treatment plan)   Justin [name]

## 2024-12-31 ENCOUNTER — Ambulatory Visit: Admitting: Psychology

## 2025-01-07 ENCOUNTER — Ambulatory Visit: Admitting: Psychology

## 2025-01-14 ENCOUNTER — Ambulatory Visit: Admitting: Psychology

## 2025-01-21 ENCOUNTER — Ambulatory Visit: Admitting: Psychology

## 2025-01-28 ENCOUNTER — Ambulatory Visit: Admitting: Psychology

## 2025-02-04 ENCOUNTER — Ambulatory Visit: Admitting: Psychology

## 2025-02-11 ENCOUNTER — Ambulatory Visit: Admitting: Psychology
